# Patient Record
Sex: Female | Born: 1975 | Race: Black or African American | Hispanic: No | Marital: Married | Smoking: Former smoker
Health system: Southern US, Community
[De-identification: ages and names within clinical notes are randomized; demographics above are authoritative.]

## PROBLEM LIST (undated history)

## (undated) HISTORY — PX: TUBAL LIGATION: SHX77

---

## 2004-08-01 ENCOUNTER — Emergency Department: Payer: Self-pay | Admitting: Emergency Medicine

## 2004-09-25 ENCOUNTER — Emergency Department: Payer: Self-pay | Admitting: General Practice

## 2005-09-01 ENCOUNTER — Emergency Department: Payer: Self-pay | Admitting: Emergency Medicine

## 2007-03-29 ENCOUNTER — Emergency Department: Payer: Self-pay | Admitting: Unknown Physician Specialty

## 2008-07-13 ENCOUNTER — Emergency Department: Payer: Self-pay | Admitting: Emergency Medicine

## 2009-10-02 ENCOUNTER — Emergency Department: Payer: Self-pay | Admitting: Emergency Medicine

## 2010-09-12 ENCOUNTER — Emergency Department: Payer: Self-pay | Admitting: Unknown Physician Specialty

## 2011-12-17 ENCOUNTER — Emergency Department: Payer: Self-pay | Admitting: Emergency Medicine

## 2011-12-17 LAB — BASIC METABOLIC PANEL
Anion Gap: 12 (ref 7–16)
Calcium, Total: 9.1 mg/dL (ref 8.5–10.1)
Co2: 23 mmol/L (ref 21–32)
Creatinine: 0.56 mg/dL — ABNORMAL LOW (ref 0.60–1.30)
Potassium: 3.8 mmol/L (ref 3.5–5.1)
Sodium: 143 mmol/L (ref 136–145)

## 2011-12-17 LAB — URINALYSIS, COMPLETE
Bacteria: NONE SEEN
Glucose,UR: NEGATIVE mg/dL (ref 0–75)
Ketone: NEGATIVE
Leukocyte Esterase: NEGATIVE
Nitrite: NEGATIVE
RBC,UR: NONE SEEN /HPF (ref 0–5)
Specific Gravity: 1.002 (ref 1.003–1.030)
Squamous Epithelial: <1

## 2011-12-17 LAB — DRUG SCREEN, URINE
Benzodiazepine, Ur Scrn: NEGATIVE (ref ?–200)
MDMA (Ecstasy)Ur Screen: NEGATIVE (ref ?–500)
Methadone, Ur Screen: NEGATIVE (ref ?–300)
Opiate, Ur Screen: NEGATIVE (ref ?–300)
Phencyclidine (PCP) Ur S: NEGATIVE (ref ?–25)

## 2011-12-17 LAB — ETHANOL: Ethanol: 392 mg/dL

## 2011-12-17 LAB — CBC
MCH: 32.7 pg (ref 26.0–34.0)
MCHC: 32.3 g/dL (ref 32.0–36.0)
RDW: 12.4 % (ref 11.5–14.5)

## 2011-12-17 LAB — PREGNANCY, URINE: Pregnancy Test, Urine: NEGATIVE m[IU]/mL

## 2017-04-02 ENCOUNTER — Encounter: Payer: Self-pay | Admitting: Emergency Medicine

## 2017-04-02 ENCOUNTER — Emergency Department
Admission: EM | Admit: 2017-04-02 | Discharge: 2017-04-02 | Disposition: A | Discharge status: Home / Self Care | Payer: Self-pay | Attending: Emergency Medicine | Admitting: Emergency Medicine

## 2017-04-02 DIAGNOSIS — Z5321 Procedure and treatment not carried out due to patient leaving prior to being seen by health care provider: Secondary | ICD-10-CM | POA: Insufficient documentation

## 2017-04-02 DIAGNOSIS — J069 Acute upper respiratory infection, unspecified: Secondary | ICD-10-CM | POA: Insufficient documentation

## 2017-04-02 NOTE — ED Triage Notes (Signed)
Symptoms for 2 days.  Reports nasal congestion, sneezing and headache.

## 2018-03-26 ENCOUNTER — Emergency Department
Admission: EM | Admit: 2018-03-26 | Discharge: 2018-03-26 | Disposition: A | Discharge status: Home / Self Care | Payer: Self-pay | Attending: Emergency Medicine | Admitting: Emergency Medicine

## 2018-03-26 ENCOUNTER — Encounter: Payer: Self-pay | Admitting: Emergency Medicine

## 2018-03-26 ENCOUNTER — Other Ambulatory Visit: Payer: Self-pay

## 2018-03-26 DIAGNOSIS — N390 Urinary tract infection, site not specified: Secondary | ICD-10-CM | POA: Insufficient documentation

## 2018-03-26 LAB — URINALYSIS, COMPLETE (UACMP) WITH MICROSCOPIC
Bilirubin Urine: NEGATIVE
Glucose, UA: NEGATIVE mg/dL
Hgb urine dipstick: NEGATIVE
Ketones, ur: NEGATIVE mg/dL
Leukocytes, UA: NEGATIVE
Nitrite: NEGATIVE
PROTEIN: NEGATIVE mg/dL
Specific Gravity, Urine: 1.008 (ref 1.005–1.030)
pH: 7 (ref 5.0–8.0)

## 2018-03-26 LAB — COMPREHENSIVE METABOLIC PANEL
ALBUMIN: 3.8 g/dL (ref 3.5–5.0)
ALT: 15 U/L (ref 0–44)
ANION GAP: 6 (ref 5–15)
AST: 16 U/L (ref 15–41)
Alkaline Phosphatase: 40 U/L (ref 38–126)
BUN: 11 mg/dL (ref 6–20)
CHLORIDE: 103 mmol/L (ref 98–111)
CO2: 29 mmol/L (ref 22–32)
Calcium: 8.9 mg/dL (ref 8.9–10.3)
Creatinine, Ser: 0.62 mg/dL (ref 0.44–1.00)
GFR calc non Af Amer: >60 mL/min (ref >60)
GLUCOSE: 103 mg/dL — AB (ref 70–99)
Potassium: 4.6 mmol/L (ref 3.5–5.1)
SODIUM: 138 mmol/L (ref 135–145)
Total Bilirubin: 0.5 mg/dL (ref 0.3–1.2)
Total Protein: 6.1 g/dL — ABNORMAL LOW (ref 6.5–8.1)

## 2018-03-26 LAB — CBC
HCT: 39.1 % (ref 35.0–47.0)
HEMOGLOBIN: 13.4 g/dL (ref 12.0–16.0)
MCH: 33.4 pg (ref 26.0–34.0)
MCHC: 34.2 g/dL (ref 32.0–36.0)
MCV: 97.5 fL (ref 80.0–100.0)
Platelets: 373 10*3/uL (ref 150–440)
RBC: 4.01 MIL/uL (ref 3.80–5.20)
RDW: 13 % (ref 11.5–14.5)
WBC: 7.4 10*3/uL (ref 3.6–11.0)

## 2018-03-26 LAB — POCT PREGNANCY, URINE: PREG TEST UR: NEGATIVE

## 2018-03-26 LAB — LIPASE, BLOOD: LIPASE: 28 U/L (ref 11–51)

## 2018-03-26 MED ORDER — CEPHALEXIN 500 MG PO CAPS: 500.0000 mg | ORAL_CAPSULE | Freq: Three times a day (TID) | ORAL | 0 refills | Status: DC

## 2018-03-26 MED ORDER — CEPHALEXIN 500 MG PO CAPS
500.0000 mg | ORAL_CAPSULE | Freq: Once | ORAL | Status: AC
  Administered 2018-03-26: 500 mg via ORAL
  Filled 2018-03-26: qty 1

## 2018-03-26 MED ORDER — TRAMADOL HCL 50 MG PO TABS: 50.0000 mg | ORAL_TABLET | Freq: Four times a day (QID) | ORAL | 0 refills | Status: DC | PRN

## 2018-03-26 NOTE — Discharge Instructions (Addendum)
If you have increased pain, fever, vomiting, or you feel worse in any way return to the emergency room.  We did not do a CT scan, S1 does not appear to be indicated now but if things worsen we may need to do something like that.  Take the antibiotics until they are gone.  If you do have a urinary tract infection that needs different antibiotics we will call you.  Do not drink or drive on the pain medication we have prescribed.  Try managing pain with Tylenol and Motrin as best you can as directed, only use the tramadol for breakthrough pain but do not drive on it

## 2018-03-26 NOTE — ED Notes (Signed)
Pt now reports she was urinating a whole lot now back to normal

## 2018-03-26 NOTE — ED Triage Notes (Signed)
LLQ pain for 2 weeks.  Has also been having lower back pain for same amount of time. Intermittent dysuria.  No fevers. Denies NVD.  Ambulatory. VSS

## 2018-03-26 NOTE — ED Provider Notes (Signed)
[facility] Emergency Department Provider Note  ____________________________________________   I have reviewed the triage vital signs and the nursing notes. Where available I have reviewed prior notes and, if possible and indicated, outside hospital notes.    HISTORY  Chief Complaint Abdominal Pain    HPI Cleon GustinHeather M Kue is a 42 y.o. female who is healthy, she states that she has had dysuria for couple days followed by some mild left flank pain.  No fever no chills.  She also has low back discomfort.  No abdominal pain no vomiting no chills no fever, normal bowel movements.  Pain is not severe no recent trauma no numbness no weakness no incontinence of bowel bladder no IV drug abuse, no vaginal discharge, no pelvic pain.  Just dysuria now a little bit of pain on the left flank and some low back pain   History reviewed. No pertinent past medical history.  There are no active problems to display for this patient.   Past Surgical History:  Procedure Laterality Date  . CESAREAN SECTION      Prior to Admission medications   Not on File    Allergies Patient has no known allergies.  History reviewed. No pertinent family history.  Social History Social History   Tobacco Use  . Smoking status: Former Games developermoker  . Smokeless tobacco: Never Used  Substance Use Topics  . Alcohol use: Never    Frequency: Never  . Drug use: Never    Review of Systems Constitutional: No fever/chills Eyes: No visual changes. ENT: No sore throat. No stiff neck no neck pain Cardiovascular: Denies chest pain. Respiratory: Denies shortness of breath. Gastrointestinal:   no vomiting.  No diarrhea.  No constipation. Genitourinary: Negative for dysuria. Musculoskeletal: Negative lower extremity swelling Skin: Negative for rash. Neurological: Negative for severe headaches, focal weakness or numbness.   ____________________________________________   PHYSICAL EXAM:  VITAL  SIGNS: ED Triage Vitals [03/26/18 1733]  Enc Vitals Group     BP (!) 144/86     Pulse Rate 90     Resp 16     Temp 98.7 F (37.1 C)     Temp Source Oral     SpO2 100 %     Weight 155 lb (70.3 kg)     Height 5\' 7"  (1.702 m)     Head Circumference      Peak Flow      Pain Score 10     Pain Loc      Pain Edu?      Excl. in GC?     Constitutional: Alert and oriented. Well appearing and in no acute distress. Eyes: Conjunctivae are normal Head: Atraumatic HEENT: No congestion/rhinnorhea. Mucous membranes are moist.  Oropharynx non-erythematous Neck:   Nontender with no meningismus, no masses, no stridor Cardiovascular: Normal rate, regular rhythm. Grossly normal heart sounds.  Good peripheral circulation. Respiratory: Normal respiratory effort.  No retractions. Lungs CTAB. Abdominal: Soft and nontender. No distention. No guarding no rebound Back:  There is no focal tenderness or step off.  there is no midline tenderness there are no lesions noted. there is very slight left CVA tenderness  Musculoskeletal: No lower extremity tenderness, no upper extremity tenderness. No joint effusions, no DVT signs strong distal pulses no edema Neurologic:  Normal speech and language. No gross focal neurologic deficits are appreciated.  Skin:  Skin is warm, dry and intact. No rash noted. Psychiatric: Mood and affect are normal. Speech and behavior are normal.  ____________________________________________   LABS (all labs ordered are listed, but only abnormal results are displayed)  Labs Reviewed  COMPREHENSIVE METABOLIC PANEL - Abnormal; Notable for the following components:      Result Value   Glucose, Bld 103 (*)    Total Protein 6.1 (*)    All other components within normal limits  URINALYSIS, COMPLETE (UACMP) WITH MICROSCOPIC - Abnormal; Notable for the following components:   Color, Urine STRAW (*)    APPearance CLEAR (*)    Bacteria, UA RARE (*)    All other components within normal  limits  URINE CULTURE  LIPASE, BLOOD  CBC  POC URINE PREG, ED  POCT PREGNANCY, URINE    Pertinent labs  results that were available during my care of the patient were reviewed by me and considered in my medical decision making (see chart for details). ____________________________________________  EKG  I personally interpreted any EKGs ordered by me or triage  ____________________________________________  RADIOLOGY  Pertinent labs & imaging results that were available during my care of the patient were reviewed by me and considered in my medical decision making (see chart for details). If possible, patient and/or family made aware of any abnormal findings.  No results found. ____________________________________________    PROCEDURES  Procedure(s) performed: None  Procedures  Critical Care performed: None  ____________________________________________   INITIAL IMPRESSION / ASSESSMENT AND PLAN / ED COURSE  Pertinent labs & imaging results that were available during my care of the patient were reviewed by me and considered in my medical decision making (see chart for details).  Patient here with dysuria, urinary frequency, now starting to have some flank pain.  Abdomen is nontender bowel signs are reassuring blood work is reassuring her white cells and bacteria in her urine we are sending a urine culture, no evidence of infected kidney stone no evidence of appendicitis.  However, patient understands that if she feels worse in any way she is return to the emergency room.  We will send her home with pain medication and antibiotics she understands that if there is any change in her presentation she is to return.    ____________________________________________   FINAL CLINICAL IMPRESSION(S) / ED DIAGNOSES  Final diagnoses:  None      This chart was dictated using voice recognition software.  Despite best efforts to proofread,  errors can occur which can change meaning.       Jeanmarie Plant, MD 03/26/18 1925

## 2018-03-26 NOTE — ED Notes (Signed)
No peripheral IV placed this visit.   Discharge instructions reviewed with patient. Questions fielded by this RN. Patient verbalizes understanding of instructions. Patient discharged home in stable condition per mcshane. No acute distress noted at time of discharge.    

## 2018-03-29 LAB — URINE CULTURE: Culture: 40000 — AB

## 2018-10-10 ENCOUNTER — Encounter: Payer: Self-pay | Admitting: Emergency Medicine

## 2018-10-10 ENCOUNTER — Emergency Department: Payer: Worker's Compensation

## 2018-10-10 ENCOUNTER — Emergency Department
Admission: EM | Admit: 2018-10-10 | Discharge: 2018-10-10 | Disposition: A | Discharge status: Home / Self Care | Payer: Worker's Compensation | Attending: Emergency Medicine | Admitting: Emergency Medicine

## 2018-10-10 ENCOUNTER — Other Ambulatory Visit: Payer: Self-pay

## 2018-10-10 DIAGNOSIS — S60911A Unspecified superficial injury of right wrist, initial encounter: Secondary | ICD-10-CM | POA: Diagnosis present

## 2018-10-10 DIAGNOSIS — Y9259 Other trade areas as the place of occurrence of the external cause: Secondary | ICD-10-CM | POA: Insufficient documentation

## 2018-10-10 DIAGNOSIS — X509XXA Other and unspecified overexertion or strenuous movements or postures, initial encounter: Secondary | ICD-10-CM | POA: Diagnosis not present

## 2018-10-10 DIAGNOSIS — S63501A Unspecified sprain of right wrist, initial encounter: Secondary | ICD-10-CM | POA: Insufficient documentation

## 2018-10-10 DIAGNOSIS — Y990 Civilian activity done for income or pay: Secondary | ICD-10-CM | POA: Insufficient documentation

## 2018-10-10 DIAGNOSIS — Z87891 Personal history of nicotine dependence: Secondary | ICD-10-CM | POA: Insufficient documentation

## 2018-10-10 DIAGNOSIS — Y939 Activity, unspecified: Secondary | ICD-10-CM | POA: Insufficient documentation

## 2018-10-10 MED ORDER — NAPROXEN 500 MG PO TABS: 500.0000 mg | ORAL_TABLET | Freq: Two times a day (BID) | ORAL | 0 refills | Status: DC

## 2018-10-10 MED ORDER — NAPROXEN 500 MG PO TABS
500.0000 mg | ORAL_TABLET | Freq: Once | ORAL | Status: AC
  Administered 2018-10-10: 500 mg via ORAL
  Filled 2018-10-10: qty 1

## 2018-10-10 NOTE — ED Triage Notes (Signed)
Pt was trying to keep box from falling off table and hurt right wrist. No obvious deformity.  ambulatory.

## 2018-10-10 NOTE — ED Provider Notes (Signed)
[facility] Emergency Department Provider Note  ____________________________________________   First MD Initiated Contact with Patient 10/10/18 1200     (approximate)  I have reviewed the triage vital signs and the nursing notes.   HISTORY  Chief Complaint Wrist Pain   HPI Paige Wade is a 43 y.o. female presents to the ED with complaint of right wrist pain.  Patient states that she was working second shift and was almost ready to get off work when she was trying to catch a box from falling from a table when she injured her wrist.  Apparently she did not report this to anyone and now is in the ED for it to be evaluated.  Company was called and they state that patient will need to have a drug screen before leaving.  She rates her pain as a 10/10.     History reviewed. No pertinent past medical history.  There are no active problems to display for this patient.   Past Surgical History:  Procedure Laterality Date   CESAREAN SECTION      Prior to Admission medications   Medication Sig Start Date End Date Taking? Authorizing Provider  naproxen (NAPROSYN) 500 MG tablet Take 1 tablet (500 mg total) by mouth 2 (two) times daily with a meal. 10/10/18   Tommi RumpsSummers, Contrell Ballentine L, PA-C    Allergies Patient has no known allergies.  History reviewed. No pertinent family history.  Social History Social History   Tobacco Use   Smoking status: Former Smoker   Smokeless tobacco: Never Used  Substance Use Topics   Alcohol use: Never    Frequency: Never   Drug use: Never    Review of Systems Constitutional: No fever/chills Cardiovascular: Denies chest pain. Respiratory: Denies shortness of breath. Musculoskeletal: Positive for right wrist pain. Skin: Negative for rash. Neurological: Negative for headaches, focal weakness or numbness. ___________________________________________   PHYSICAL EXAM:  VITAL SIGNS: ED Triage Vitals  Enc Vitals Group    BP 10/10/18 1155 (!) 136/93     Pulse Rate 10/10/18 1155 (!) 105     Resp 10/10/18 1155 16     Temp 10/10/18 1155 98.4 F (36.9 C)     Temp Source 10/10/18 1155 Oral     SpO2 10/10/18 1155 99 %     Weight 10/10/18 1153 153 lb (69.4 kg)     Height 10/10/18 1153 5\' 7"  (1.702 m)     Head Circumference --      Peak Flow --      Pain Score 10/10/18 1153 10     Pain Loc --      Pain Edu? --      Excl. in GC? --    Constitutional: Alert and oriented. Well appearing and in no acute distress. Eyes: Conjunctivae are normal.  Head: Atraumatic. Neck: No stridor.   Cardiovascular: Normal rate, regular rhythm. Grossly normal heart sounds.  Good peripheral circulation. Respiratory: Normal respiratory effort.  No retractions. Lungs CTAB. Musculoskeletal: Right wrist without any signs of injury, soft tissue swelling or discoloration.  Range of motion is decreased in all planes secondary to discomfort.  Patient is able to move digits without any difficulty.  Skin is intact.  Capillary refill is less than 3 seconds.  There is generalized tenderness on palpation of the wrist but no point tenderness. Neurologic:  Normal speech and language. No gross focal neurologic deficits are appreciated. No gait instability. Skin:  Skin is warm, dry and intact. No rash noted.  Psychiatric: Mood and affect are normal. Speech and behavior are normal.  ____________________________________________   LABS (all labs ordered are listed, but only abnormal results are displayed)  Labs Reviewed - No data to display  RADIOLOGY  Official radiology report(s): Dg Wrist Complete Right  Result Date: 10/10/2018 CLINICAL DATA:  43 year old female with a history of wrist pain EXAM: RIGHT WRIST - COMPLETE 3+ VIEW COMPARISON:  None. FINDINGS: No acute displaced fracture. No focal soft tissue swelling. No radiopaque foreign body. No significant degenerative changes. IMPRESSION: Negative for acute bony Electronically Signed   By:  Gilmer Mor D.O.   On: 10/10/2018 12:51    ____________________________________________   PROCEDURES  Procedure(s) performed (including Critical Care):  Procedures Cock-up wrist splint was applied to the right wrist by Asencion Partridge, ED tech.  ____________________________________________   INITIAL IMPRESSION / ASSESSMENT AND PLAN / ED COURSE  As part of my medical decision making, I reviewed the following data within the electronic MEDICAL RECORD NUMBER Notes from prior ED visits and [place] Controlled Substance Database  43 year old female presents to the ED with complaint of injury to her right wrist at the end of her shift while at work this morning at approximately 2 AM.  Patient states that she was trying to keep a box from falling off the table when she hurt her wrist.  X-rays were reassuring and physical exam does not show any gross abnormality.  Patient was given naproxen 500 mg in the ED along with a cock-up wrist splint.  She is to continue with the naproxen.  She was instructed to use ice and elevation.  She is to follow-up with her supervisor for light duty and follow-up with [facility] clinic if any continued problems.  ____________________________________________   FINAL CLINICAL IMPRESSION(S) / ED DIAGNOSES  Final diagnoses:  Sprain of right wrist, initial encounter     ED Discharge Orders         Ordered    naproxen (NAPROSYN) 500 MG tablet  2 times daily with meals     10/10/18 1258           Note:  This document was prepared using Dragon voice recognition software and may include unintentional dictation errors.    Tommi Rumps, PA-C 10/10/18 1417    Emily Filbert, MD 10/10/18 [phone]

## 2018-10-10 NOTE — ED Notes (Signed)
See triage note  Presents with pain to right wrist  States she tried to catch a box from falling from table last pm  States she works 2nd shift and gets off at 2 am  States pain woke her up  Tenderness noted to lateral aspect of wrist   Good pulses

## 2018-10-10 NOTE — Discharge Instructions (Signed)
Follow-up with a [facility] if any continued problems.  Ice and elevation today.  Also follow-up with the appropriate people at your work for determination of light duty. Begin taking naproxen 500 mg twice daily with food.  Wear wrist splint for support and protection.

## 2019-07-12 ENCOUNTER — Encounter: Payer: Self-pay | Admitting: Emergency Medicine

## 2019-07-12 ENCOUNTER — Emergency Department
Admission: EM | Admit: 2019-07-12 | Discharge: 2019-07-12 | Disposition: A | Discharge status: Home / Self Care | Payer: Self-pay | Attending: Student in an Organized Health Care Education/Training Program | Admitting: Student in an Organized Health Care Education/Training Program

## 2019-07-12 ENCOUNTER — Other Ambulatory Visit: Payer: Self-pay

## 2019-07-12 ENCOUNTER — Emergency Department: Payer: Self-pay

## 2019-07-12 DIAGNOSIS — Z87891 Personal history of nicotine dependence: Secondary | ICD-10-CM | POA: Insufficient documentation

## 2019-07-12 DIAGNOSIS — R0602 Shortness of breath: Secondary | ICD-10-CM | POA: Insufficient documentation

## 2019-07-12 DIAGNOSIS — F410 Panic disorder [episodic paroxysmal anxiety] without agoraphobia: Secondary | ICD-10-CM | POA: Insufficient documentation

## 2019-07-12 LAB — CBC WITH DIFFERENTIAL/PLATELET
Abs Immature Granulocytes: 0.01 10*3/uL (ref 0.00–0.07)
Basophils Absolute: 0.1 10*3/uL (ref 0.0–0.1)
Basophils Relative: 1 %
Eosinophils Absolute: 0.1 10*3/uL (ref 0.0–0.5)
Eosinophils Relative: 2 %
HCT: 39.8 % (ref 36.0–46.0)
Hemoglobin: 13.5 g/dL (ref 12.0–15.0)
Immature Granulocytes: 0 %
Lymphocytes Relative: 34 %
Lymphs Abs: 1.5 10*3/uL (ref 0.7–4.0)
MCH: 31.4 pg (ref 26.0–34.0)
MCHC: 33.9 g/dL (ref 30.0–36.0)
MCV: 92.6 fL (ref 80.0–100.0)
Monocytes Absolute: 0.4 10*3/uL (ref 0.1–1.0)
Monocytes Relative: 8 %
Neutro Abs: 2.5 10*3/uL (ref 1.7–7.7)
Neutrophils Relative %: 55 %
Platelets: 326 10*3/uL (ref 150–400)
RBC: 4.3 MIL/uL (ref 3.87–5.11)
RDW: 12.6 % (ref 11.5–15.5)
WBC: 4.5 10*3/uL (ref 4.0–10.5)
nRBC: 0 % (ref 0.0–0.2)

## 2019-07-12 LAB — COMPREHENSIVE METABOLIC PANEL
ALT: 40 U/L (ref 0–44)
AST: 40 U/L (ref 15–41)
Albumin: 4.5 g/dL (ref 3.5–5.0)
Alkaline Phosphatase: 75 U/L (ref 38–126)
Anion gap: 14 (ref 5–15)
BUN: 6 mg/dL (ref 6–20)
CO2: 19 mmol/L — ABNORMAL LOW (ref 22–32)
Calcium: 9.4 mg/dL (ref 8.9–10.3)
Chloride: 105 mmol/L (ref 98–111)
Creatinine, Ser: 0.67 mg/dL (ref 0.44–1.00)
GFR calc Af Amer: >60 mL/min (ref >60)
GFR calc non Af Amer: >60 mL/min (ref >60)
Glucose, Bld: 99 mg/dL (ref 70–99)
Potassium: 3.9 mmol/L (ref 3.5–5.1)
Sodium: 138 mmol/L (ref 135–145)
Total Bilirubin: 0.8 mg/dL (ref 0.3–1.2)
Total Protein: 7.7 g/dL (ref 6.5–8.1)

## 2019-07-12 MED ORDER — DIAZEPAM 5 MG PO TABS
ORAL_TABLET | ORAL | Status: AC
  Filled 2019-07-12: qty 1

## 2019-07-12 MED ORDER — DIAZEPAM 5 MG PO TABS
5.0000 mg | ORAL_TABLET | Freq: Once | ORAL | Status: AC
  Administered 2019-07-12: 19:00:00 5 mg via ORAL
  Filled 2019-07-12: qty 1

## 2019-07-12 MED ORDER — SODIUM CHLORIDE 0.9 % IV BOLUS
500.0000 mL | Freq: Once | INTRAVENOUS | Status: AC
  Administered 2019-07-12: 500 mL via INTRAVENOUS

## 2019-07-12 NOTE — ED Triage Notes (Signed)
Patient states symptoms feels like a panic attack.

## 2019-07-12 NOTE — Discharge Instructions (Addendum)
Please establish care with primary care physician to help manage her anxiety.  Please review reading material on managing anxiety.  Return to the ER for any worsening symptoms or urgent changes in health.

## 2019-07-12 NOTE — ED Triage Notes (Signed)
Pt comes into the ED via EMS from the [facility] with numbness/tingling, SOb that started about PTA, 100%RA , ST, #20 LFA, 98.3, CBG 119. Pt was anxious when EMS arrival.

## 2019-07-12 NOTE — ED Notes (Addendum)
Patient reports shortness of breath today worsening gradually throughout the day.  Patient reports chills and hot flashes as well as feeling dizzy and light headed today.  Patient denies vomiting or diarrhea.  Patient states she feels very thirsty, denies polyuria.  Patient is alert and oriented x 4.  Skin is warm and dry.  Patient states, "this has happened before, it might have been dehydration because I had to drink a lot of water for it to stop."  Patient reports increased stress and anxiety today.

## 2019-07-12 NOTE — ED Provider Notes (Signed)
[facility] EMERGENCY DEPARTMENT Provider Note   CSN: [id number] Arrival date & time: 07/12/19  1457     History Chief Complaint  Patient presents with  . Shortness of Breath    Paige Wade is a 44 y.o. female presents to the emergency department for evaluation of shortness of breath.  Patient states she woke up with shortness of breath this morning, states she was hyperventilating and panicking.  She describes a heaviness in her chest along with numbness and tingling along her lips and digits of both hands.  Her symptoms lasted on and off 2030 minutes this morning.  She denies any cough, congestion, runny nose, fevers, abdominal pain, nausea or vomiting.  She does admit to having some anxiety, having stressful time with her husband as they have been fussing.  She admits to having similar symptoms a month ago that resolved on their own.  Currently she feels 75% better in regards to her shortness of breath.  She denies any current chest pain or discomfort.  No wheezing.  No history of blood clots, lower extremity swelling, birth control medications.  Patient does smoke.  She admits to not eating or drinking over the last couple of days due to lack of appetite which is overall due to constant stressful time with her husband.  HPI     History reviewed. No pertinent past medical history.  There are no problems to display for this patient.   Past Surgical History:  Procedure Laterality Date  . CESAREAN SECTION       OB History   No obstetric history on file.     No family history on file.  Social History   Tobacco Use  . Smoking status: Former Research scientist (life sciences)  . Smokeless tobacco: Never Used  Substance Use Topics  . Alcohol use: Never  . Drug use: Never    Home Medications Prior to Admission medications   Not on File    Allergies    Patient has no known allergies.  Review of Systems   Review of Systems  Constitutional: Negative for chills, fatigue  and fever.  HENT: Negative for trouble swallowing.   Respiratory: Positive for shortness of breath. Negative for cough, choking and chest tightness.   Cardiovascular: Negative for chest pain.  Gastrointestinal: Negative for abdominal pain, nausea and vomiting.  Skin: Negative for rash.  Neurological: Negative for dizziness, light-headedness and headaches.    Physical Exam Updated Vital Signs BP (!) 166/97 (BP Location: Left Arm)   Pulse (!) 114   Temp 98.9 F (37.2 C) (Oral)   Resp 18   Ht 5\' 7"  (1.702 m)   Wt 69.4 kg   LMP 06/18/2019   SpO2 99%   BMI 23.96 kg/m   Physical Exam Constitutional:      General: She is not in acute distress.    Appearance: She is well-developed. She is not ill-appearing.  HENT:     Head: Normocephalic and atraumatic.     Nose: Nose normal. No congestion or rhinorrhea.     Mouth/Throat:     Mouth: Mucous membranes are moist.     Pharynx: No oropharyngeal exudate or posterior oropharyngeal erythema.  Eyes:     Conjunctiva/sclera: Conjunctivae normal.  Cardiovascular:     Rate and Rhythm: Tachycardia present.  Pulmonary:     Effort: Pulmonary effort is normal. No respiratory distress.     Breath sounds: Normal breath sounds. No stridor. No wheezing or rales.     Comments: Initially  mild hyperventilation but after talking the patient she was much more relaxed.  Normal breathing and rate. Abdominal:     General: There is no distension.     Tenderness: There is no abdominal tenderness. There is no guarding.  Musculoskeletal:        General: No swelling or tenderness. Normal range of motion.     Cervical back: Normal range of motion.     Right lower leg: No edema.     Left lower leg: No edema.  Skin:    General: Skin is warm.     Findings: No rash.  Neurological:     General: No focal deficit present.     Mental Status: She is alert and oriented to person, place, and time. Mental status is at baseline.  Psychiatric:        Behavior:  Behavior normal.        Thought Content: Thought content normal.     ED Results / Procedures / Treatments   Labs (all labs ordered are listed, but only abnormal results are displayed) Labs Reviewed  COMPREHENSIVE METABOLIC PANEL - Abnormal; Notable for the following components:      Result Value   CO2 19 (*)    All other components within normal limits  CBC WITH DIFFERENTIAL/PLATELET    EKG None  Radiology DG Chest 2 View  Result Date: 07/12/2019 CLINICAL DATA:  Shortness of breath EXAM: CHEST - 2 VIEW COMPARISON:  None. FINDINGS: The heart size and mediastinal contours are within normal limits. Both lungs are clear. No pleural effusion or pneumothorax. The visualized skeletal structures are unremarkable. IMPRESSION: No acute process in the chest. Electronically Signed   By: Guadlupe Spanish M.D.   On: 07/12/2019 16:35    Procedures Procedures (including critical care time)  Medications Ordered in ED Medications  diazepam (VALIUM) 5 MG tablet (has no administration in time range)  sodium chloride 0.9 % bolus 500 mL (500 mLs Intravenous New Bag/Given 07/12/19 1913)  diazepam (VALIUM) tablet 5 mg (5 mg Oral Given 07/12/19 1909)    ED Course  I have reviewed the triage vital signs and the nursing notes.  Pertinent labs & imaging results that were available during my care of the patient were reviewed by me and considered in my medical decision making (see chart for details).    MDM Rules/Calculators/A&P                      44 year old female with shortness of breath.  Based on history exam she seems to be very anxious.  She admits not having much to eat or drink over the last 2 days due to stress.  Labs and chest x-ray as well as EKG are all normal.  Overall symptoms are 75% better.  No chest pain.  We will give her some IV fluids and have her continue with p.o. fluids.  We will also give her a small dose of Valium to help with anxiousness.  Will recheck symptoms of anxiousness,  shortness of breath and vitals after fluids and Valium.   After p.o. and IV fluids along with 5 mg of Valium patient symptoms had resolved.  She denies any shortness of breath or feelings of anxiousness.  Patient states she was able to rest and was feeling well.  Patient educated on anxiety and panic attacks.  She understands importance of establishing care with primary care provider.  She is given information on [facility].  She understands signs symptoms  return to ED for.  Final Clinical Impression(s) / ED Diagnoses Final diagnoses:  SOB (shortness of breath)  Panic attack    Rx / DC Orders ED Discharge Orders    None       Ronnette Juniper 07/12/19 2044    Willy Eddy, MD 07/12/19 2045

## 2020-01-15 ENCOUNTER — Telehealth: Payer: Self-pay | Admitting: General Practice

## 2020-01-15 NOTE — Telephone Encounter (Signed)
Individual has been contacted 3+ times regarding ED referral. No further attempts to contact individual will be made. 

## 2020-03-04 ENCOUNTER — Ambulatory Visit: Payer: Self-pay

## 2020-03-11 ENCOUNTER — Encounter: Payer: Self-pay | Admitting: Advanced Practice Midwife

## 2020-03-11 ENCOUNTER — Ambulatory Visit (LOCAL_COMMUNITY_HEALTH_CENTER): Payer: Medicaid Other | Admitting: Advanced Practice Midwife

## 2020-03-11 ENCOUNTER — Other Ambulatory Visit: Payer: Self-pay

## 2020-03-11 DIAGNOSIS — F17200 Nicotine dependence, unspecified, uncomplicated: Secondary | ICD-10-CM | POA: Diagnosis not present

## 2020-03-11 DIAGNOSIS — Z9851 Tubal ligation status: Secondary | ICD-10-CM

## 2020-03-11 DIAGNOSIS — Z3009 Encounter for other general counseling and advice on contraception: Secondary | ICD-10-CM | POA: Diagnosis not present

## 2020-03-11 DIAGNOSIS — I10 Essential (primary) hypertension: Secondary | ICD-10-CM | POA: Diagnosis not present

## 2020-03-11 NOTE — Progress Notes (Signed)
Patient here for PE, needs Pap test (last Pap test 2008, per patient). States had BTL in 2005. Patient counseled that due to her BTL, she may not receive family planning services at [facility] and she will need to seek primary care for PE, pap and BP follow-up. Patient counseled on need for BP follow-up asap due to elevated BP. Patient not seen by provider today, given PCP list and emergency services contact card. Patient was interested in covid vaccine and was walked down to registration area.Burt Knack, RN

## 2020-12-18 IMAGING — DX RIGHT WRIST - COMPLETE 3+ VIEW
4 series · 4 of 4 positions shown · non-contrast
Comparison: None.

CLINICAL DATA: 42-year-old female with a history of wrist pain

EXAM:
RIGHT WRIST - COMPLETE 3+ VIEW

[wrist ap (1 of 2)]
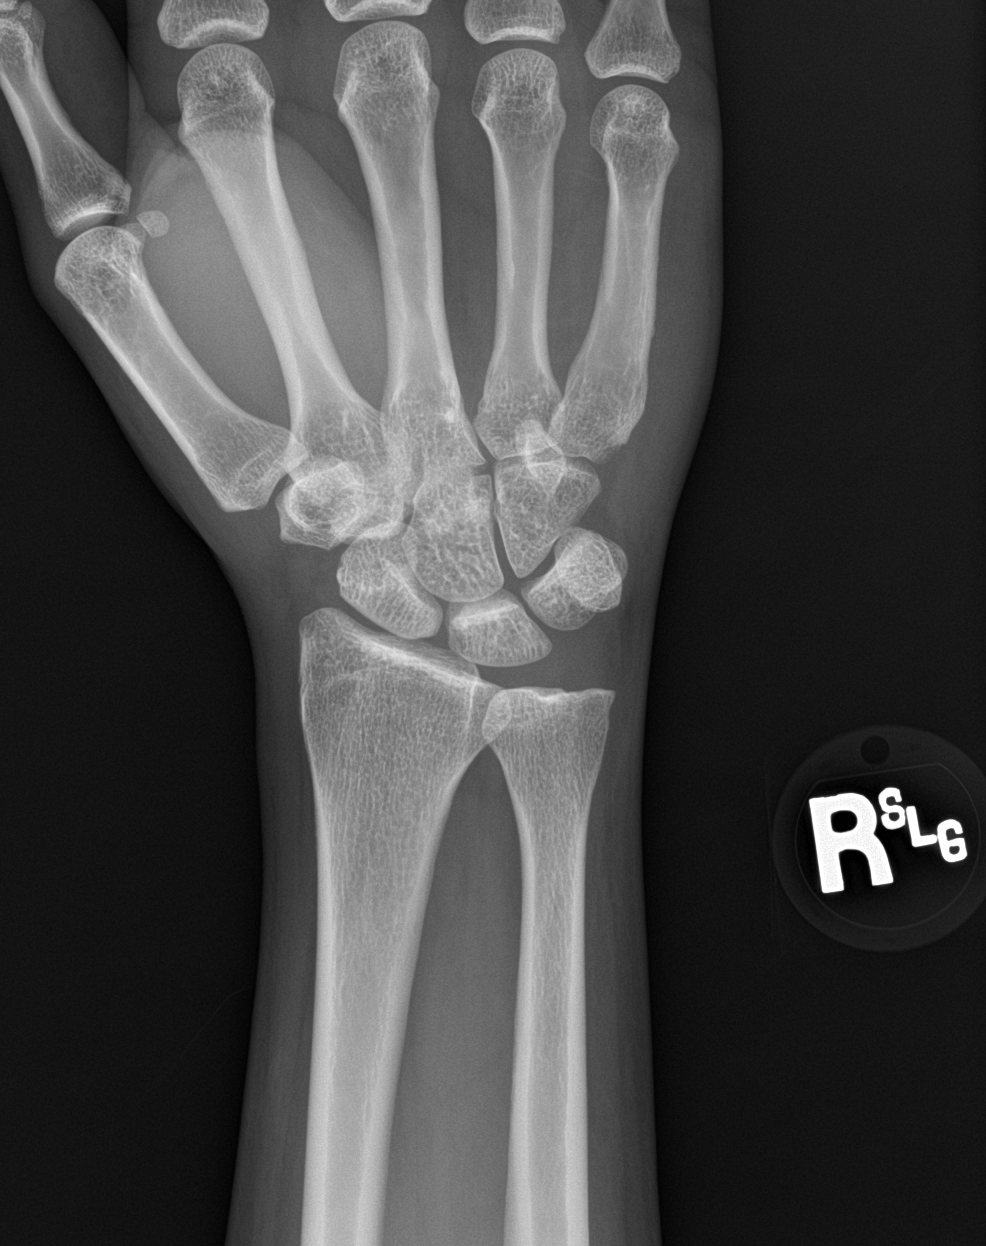

[wrist obl]
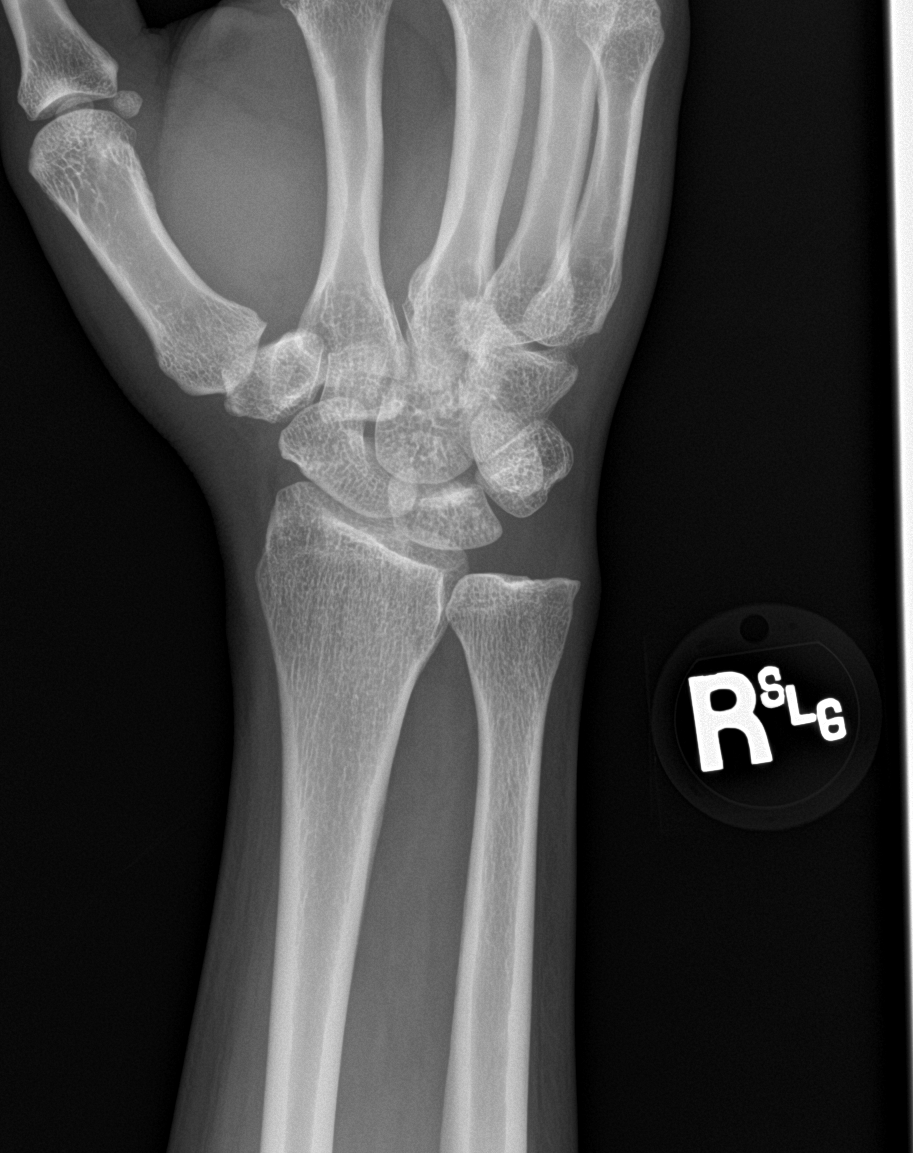

[wrist lat]
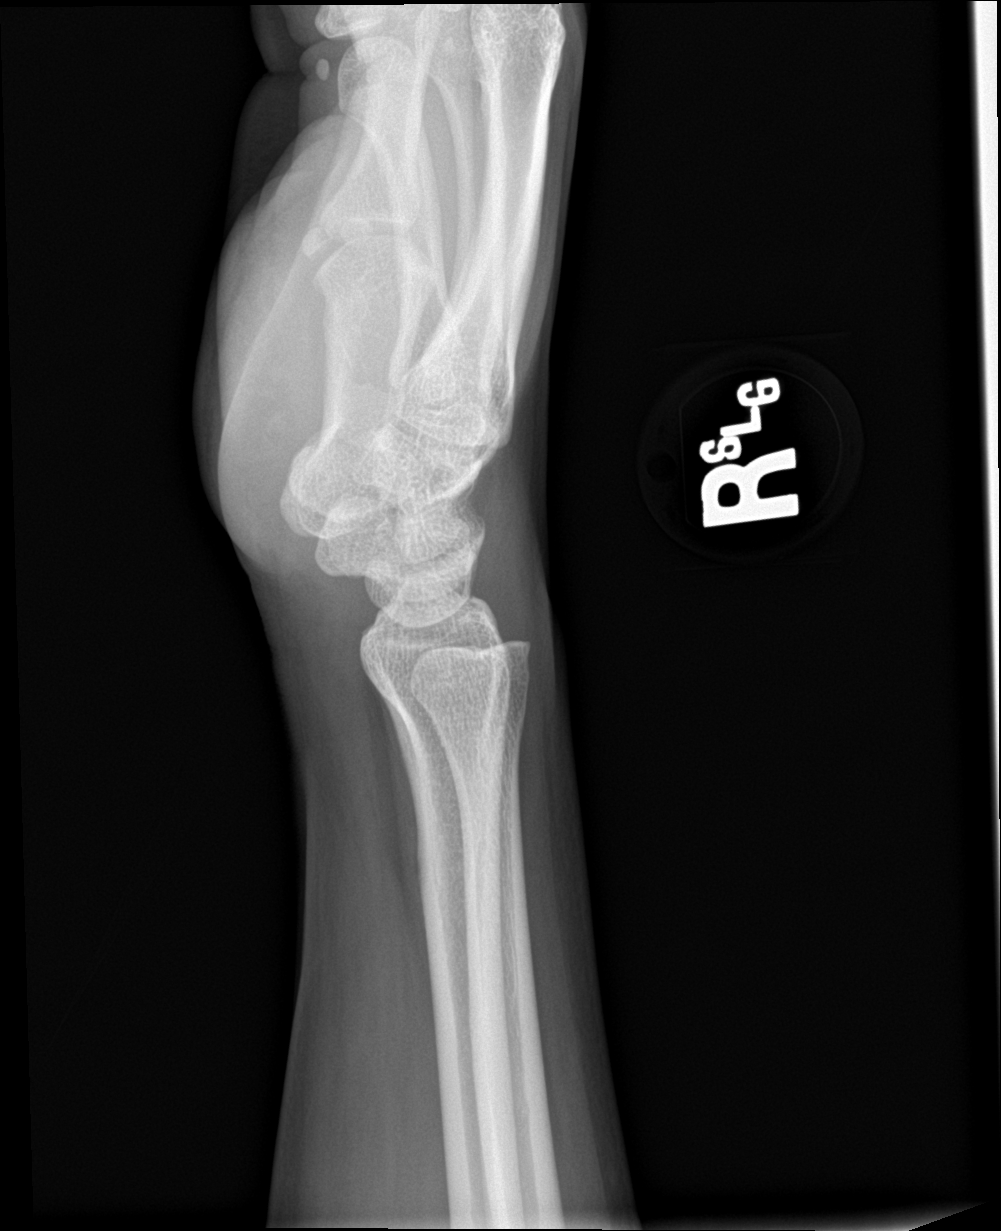

[wrist ap (2 of 2)]
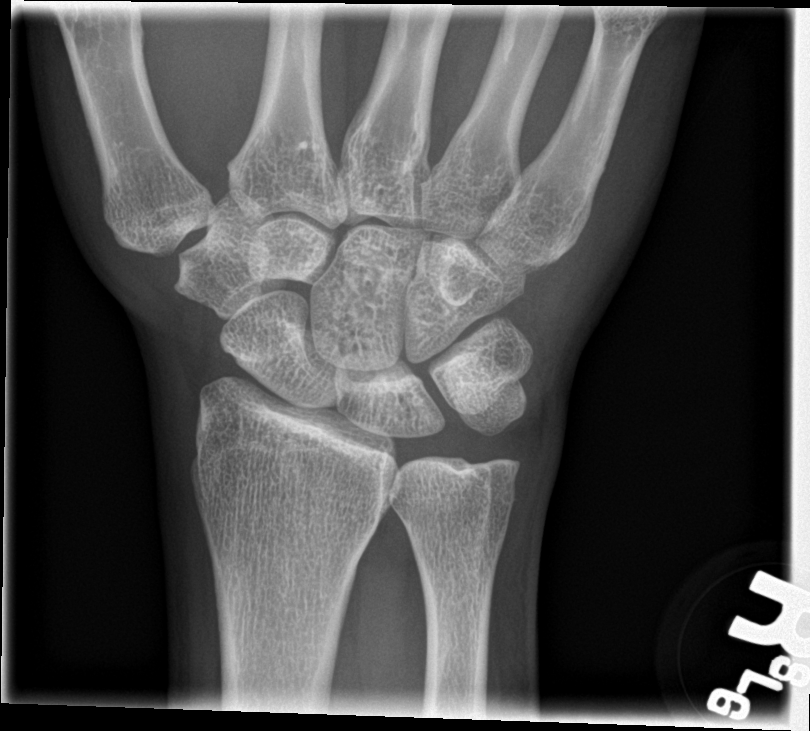

[4 of 4 positions shown; findings below may reference images not displayed]

FINDINGS: No acute displaced fracture. No focal soft tissue swelling. No
radiopaque foreign body. No significant degenerative changes.
IMPRESSION: Negative for acute bony

## 2021-09-19 IMAGING — CR DG CHEST 2V
1 series · 2 of 2 positions shown · non-contrast
Comparison: None.

CLINICAL DATA: Shortness of breath

EXAM:
CHEST - 2 VIEW

[Series 1: dg chest 2 view · 0.14mm/px · 2 of 2 slices shown]
[im 1/2]
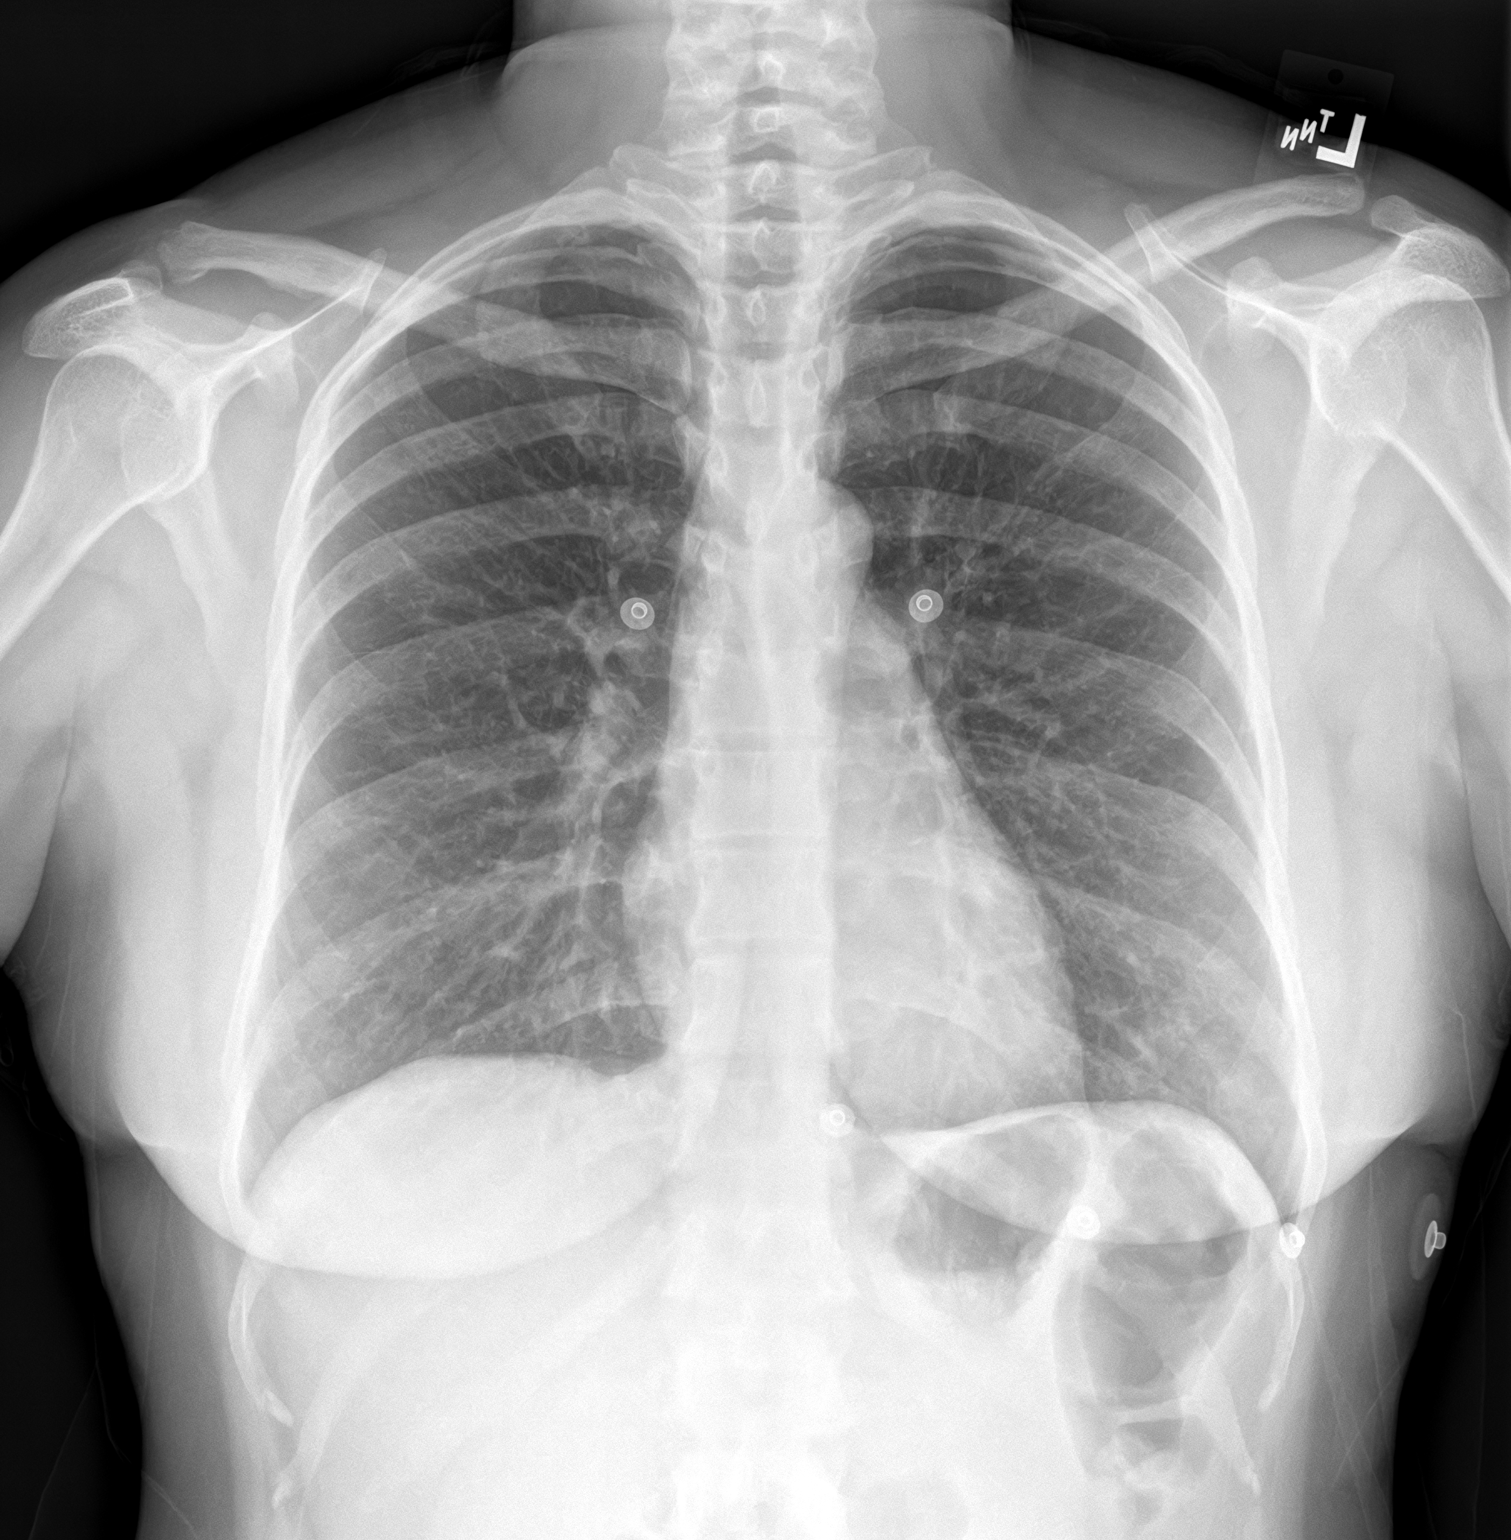
[im 2/2]
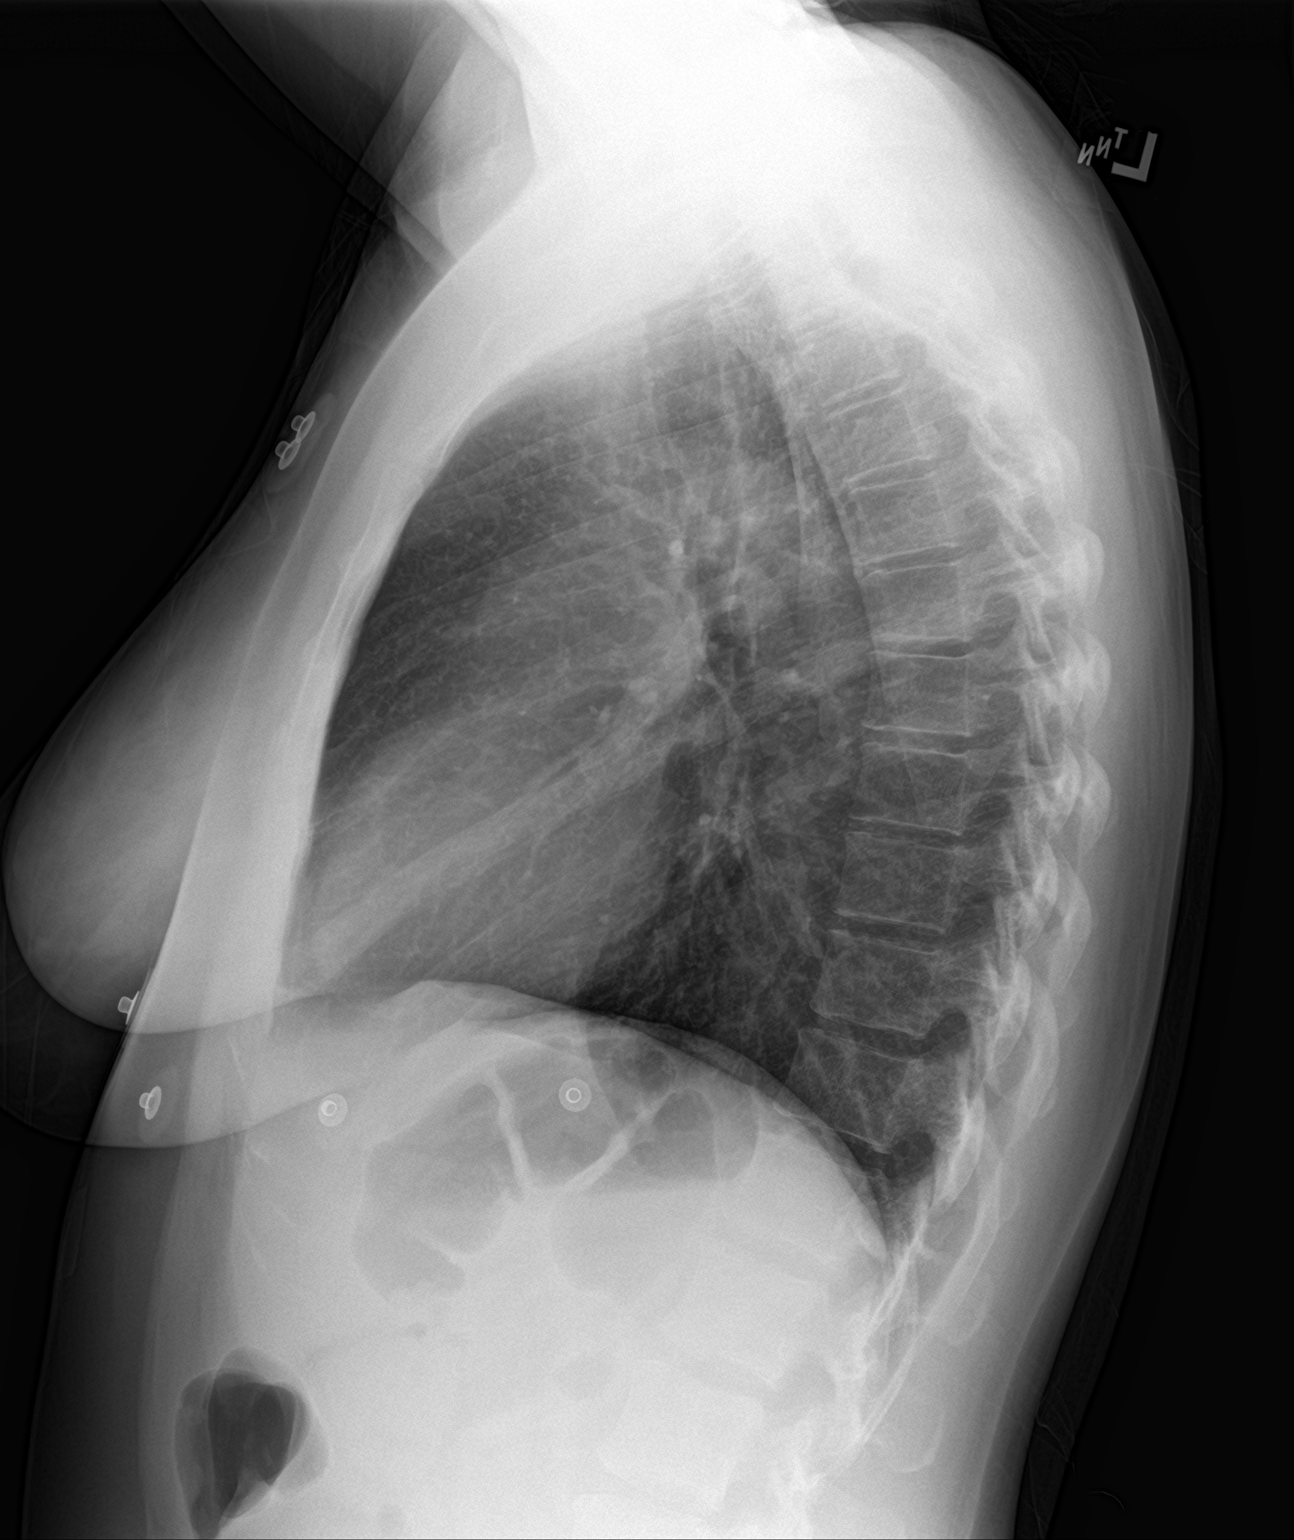

[2 of 2 positions shown; findings below may reference images not displayed]

FINDINGS: The heart size and mediastinal contours are within normal limits.
Both lungs are clear. No pleural effusion or pneumothorax. The
visualized skeletal structures are unremarkable.
IMPRESSION: No acute process in the chest.

## 2022-02-20 ENCOUNTER — Emergency Department: Payer: Medicaid Other

## 2022-02-20 ENCOUNTER — Other Ambulatory Visit: Payer: Self-pay

## 2022-02-20 ENCOUNTER — Emergency Department
Admission: EM | Admit: 2022-02-20 | Discharge: 2022-02-21 | Disposition: A | Discharge status: Home / Self Care | Payer: Medicaid Other | Attending: Emergency Medicine | Admitting: Emergency Medicine

## 2022-02-20 DIAGNOSIS — K529 Noninfective gastroenteritis and colitis, unspecified: Secondary | ICD-10-CM | POA: Diagnosis not present

## 2022-02-20 DIAGNOSIS — R1032 Left lower quadrant pain: Secondary | ICD-10-CM | POA: Diagnosis present

## 2022-02-20 DIAGNOSIS — F17200 Nicotine dependence, unspecified, uncomplicated: Secondary | ICD-10-CM | POA: Diagnosis not present

## 2022-02-20 DIAGNOSIS — D72829 Elevated white blood cell count, unspecified: Secondary | ICD-10-CM | POA: Insufficient documentation

## 2022-02-20 DIAGNOSIS — R1084 Generalized abdominal pain: Secondary | ICD-10-CM

## 2022-02-20 DIAGNOSIS — I10 Essential (primary) hypertension: Secondary | ICD-10-CM | POA: Diagnosis not present

## 2022-02-20 LAB — COMPREHENSIVE METABOLIC PANEL
ALT: 19 U/L (ref 0–44)
AST: 19 U/L (ref 15–41)
Albumin: 4.1 g/dL (ref 3.5–5.0)
Alkaline Phosphatase: 62 U/L (ref 38–126)
Anion gap: 10 (ref 5–15)
BUN: 8 mg/dL (ref 6–20)
CO2: 24 mmol/L (ref 22–32)
Calcium: 9.2 mg/dL (ref 8.9–10.3)
Chloride: 103 mmol/L (ref 98–111)
Creatinine, Ser: 0.85 mg/dL (ref 0.44–1.00)
GFR, Estimated: >60 mL/min (ref >60)
Glucose, Bld: 101 mg/dL — ABNORMAL HIGH (ref 70–99)
Potassium: 3.7 mmol/L (ref 3.5–5.1)
Sodium: 137 mmol/L (ref 135–145)
Total Bilirubin: 0.7 mg/dL (ref 0.3–1.2)
Total Protein: 7.4 g/dL (ref 6.5–8.1)

## 2022-02-20 LAB — URINALYSIS, ROUTINE W REFLEX MICROSCOPIC
Bilirubin Urine: NEGATIVE
Glucose, UA: NEGATIVE mg/dL
Hgb urine dipstick: NEGATIVE
Ketones, ur: NEGATIVE mg/dL
Leukocytes,Ua: NEGATIVE
Nitrite: NEGATIVE
Protein, ur: NEGATIVE mg/dL
Specific Gravity, Urine: 1.002 — ABNORMAL LOW (ref 1.005–1.030)
pH: 7 (ref 5.0–8.0)

## 2022-02-20 LAB — TYPE AND SCREEN
ABO/RH(D): A POS
Antibody Screen: NEGATIVE

## 2022-02-20 LAB — CBC
HCT: 42.2 % (ref 36.0–46.0)
Hemoglobin: 14 g/dL (ref 12.0–15.0)
MCH: 31.8 pg (ref 26.0–34.0)
MCHC: 33.2 g/dL (ref 30.0–36.0)
MCV: 95.9 fL (ref 80.0–100.0)
Platelets: 420 10*3/uL — ABNORMAL HIGH (ref 150–400)
RBC: 4.4 MIL/uL (ref 3.87–5.11)
RDW: 13 % (ref 11.5–15.5)
WBC: 12.5 10*3/uL — ABNORMAL HIGH (ref 4.0–10.5)
nRBC: 0 % (ref 0.0–0.2)

## 2022-02-20 LAB — POC URINE PREG, ED: Preg Test, Ur: NEGATIVE

## 2022-02-20 MED ORDER — MORPHINE SULFATE (PF) 4 MG/ML IV SOLN
4.0000 mg | Freq: Once | INTRAVENOUS | Status: AC
  Administered 2022-02-20: 4 mg via INTRAVENOUS
  Filled 2022-02-20: qty 1

## 2022-02-20 MED ORDER — SODIUM CHLORIDE 0.9 % IV BOLUS
1000.0000 mL | Freq: Once | INTRAVENOUS | Status: AC
  Administered 2022-02-20: 1000 mL via INTRAVENOUS

## 2022-02-20 MED ORDER — ONDANSETRON HCL 4 MG/2ML IJ SOLN
4.0000 mg | Freq: Once | INTRAMUSCULAR | Status: AC
  Administered 2022-02-20: 4 mg via INTRAVENOUS
  Filled 2022-02-20: qty 2

## 2022-02-20 MED ORDER — IOHEXOL 300 MG/ML  SOLN
100.0000 mL | Freq: Once | INTRAMUSCULAR | Status: AC | PRN
  Administered 2022-02-21: 100 mL via INTRAVENOUS

## 2022-02-20 NOTE — ED Triage Notes (Signed)
Pt had abdominal pain that started yesterday. She had diarrhea yesterday with no blood in stool. Today pt has not been able to have a BM other than a small firm stool. When pt wiped she noticed blood. Each time pt has felt she needed to go since she has had no stool only a smear of blood.

## 2022-02-20 NOTE — ED Provider Notes (Signed)
[facility] Provider Note    Event Date/Time   First MD Initiated Contact with Patient 02/20/22 2330     (approximate)   History   Abdominal Pain   HPI  Paige Wade is a 46 y.o. female who presents to the ED from home with a chief complaint of abdominal pain.  Symptoms began yesterday with upper abdominal cramping, nausea, vomiting and diarrhea.  Today patient complains of mainly lower abdominal cramping, left > right.  Now having bright red blood per rectum and a tiny firm stool.  Denies fever, chills, chest pain, shortness of breath, dysuria.  No history of IBD or diverticulitis.  Remote history of IBS per patient.     Past Medical History  No past medical history on file.   Active Problem List   Patient Active Problem List   Diagnosis Date Noted  . H/O tubal ligation 2005 03/11/2020  . Smoker 03/11/2020  . Hypertension 03/11/2020     Past Surgical History   Past Surgical History:  Procedure Laterality Date  . CESAREAN SECTION       Home Medications   Prior to Admission medications   Not on File     Allergies  Patient has no known allergies.   Family History  No family history on file.   Physical Exam  Triage Vital Signs: ED Triage Vitals  Enc Vitals Group     BP 02/20/22 2146 (!) 169/109     Pulse Rate 02/20/22 2146 (!) 105     Resp 02/20/22 2146 16     Temp 02/20/22 2146 98.3 F (36.8 C)     Temp Source 02/20/22 2146 Oral     SpO2 02/20/22 2146 97 %     Weight 02/20/22 2146 163 lb (73.9 kg)     Height 02/20/22 2146 5\' 7"  (1.702 m)     Head Circumference --      Peak Flow --      Pain Score 02/20/22 2154 9     Pain Loc --      Pain Edu? --      Excl. in [place]? --     Updated Vital Signs: BP (!) 145/100   Pulse 81   Temp 98.3 F (36.8 C) (Oral)   Resp 16   Ht 5\' 7"  (1.702 m)   Wt 73.9 kg   LMP 02/13/2022 (Exact Date)   SpO2 99%   BMI 25.53 kg/m    General: Awake, no distress.  CV:  RRR.  Good  peripheral perfusion.  Resp:  Normal effort.  CTA B. Abd:  Mildly tender to lower abdomen, particularly left lower quadrant without rebound or guarding.  No distention.  Other:  No truncal vesicles.   ED Results / Procedures / Treatments  Labs (all labs ordered are listed, but only abnormal results are displayed) Labs Reviewed  COMPREHENSIVE METABOLIC PANEL - Abnormal; Notable for the following components:      Result Value   Glucose, Bld 101 (*)    All other components within normal limits  CBC - Abnormal; Notable for the following components:   WBC 12.5 (*)    Platelets 420 (*)    All other components within normal limits  URINALYSIS, ROUTINE W REFLEX MICROSCOPIC - Abnormal; Notable for the following components:   Color, Urine STRAW (*)    APPearance CLEAR (*)    Specific Gravity, Urine 1.002 (*)    All other components within normal limits  POC URINE PREG,  ED  TYPE AND SCREEN     EKG  None   RADIOLOGY I have independently visualized and interpreted patient's CT as well as noted the radiology interpretation:  CT abdomen/pelvis: Colitis of the mid to distal descending and sigmoid colon, no SBO, normal appendix  Official radiology report(s): CT Abdomen Pelvis W Contrast  Result Date: 02/21/2022 CLINICAL DATA:  Left lower quadrant abdominal pain. EXAM: CT ABDOMEN AND PELVIS WITH CONTRAST TECHNIQUE: Multidetector CT imaging of the abdomen and pelvis was performed using the standard protocol following bolus administration of intravenous contrast. RADIATION DOSE REDUCTION: This exam was performed according to the departmental dose-optimization program which includes automated exposure control, adjustment of the mA and/or kV according to patient size and/or use of iterative reconstruction technique. CONTRAST:  OMNIPAQUE IOHEXOL 300 MG/ML  SOLN COMPARISON:  None Available. FINDINGS: Lower chest: The visualized lung bases are clear. No intra-abdominal free air.  Small free  fluid in the pelvis. Hepatobiliary: No focal liver abnormality is seen. No gallstones, gallbladder wall thickening, or biliary dilatation. Pancreas: Unremarkable. No pancreatic ductal dilatation or surrounding inflammatory changes. Spleen: Normal in size without focal abnormality. Adrenals/Urinary Tract: The adrenal glands are unremarkable. There is no hydronephrosis on either side. The visualized ureters and urinary bladder appear unremarkable. Stomach/Bowel: There is inflammatory changes and thickening of the mid to distal descending as well as sigmoid colon consistent with colitis. No bowel obstruction. The appendix is normal. Vascular/Lymphatic: The abdominal aorta and IVC are unremarkable. No portal venous gas. There is no adenopathy. Reproductive: The uterus and ovaries are grossly unremarkable. Other: None Musculoskeletal: No acute or significant osseous findings. IMPRESSION: Colitis of the mid to distal descending and sigmoid colon. No bowel obstruction. Normal appendix. Electronically Signed   By: Elgie Collard M.D.   On: 02/21/2022 00:25     PROCEDURES:  Critical Care performed: No  .1-3 Lead EKG Interpretation  Performed by: Irean Hong, MD Authorized by: Irean Hong, MD     Interpretation: normal     ECG rate:  95   ECG rate assessment: normal     Rhythm: sinus rhythm     Ectopy: none     Conduction: normal   Comments:     Patient placed on cardiac monitor to evaluate for arrhythmias    MEDICATIONS ORDERED IN ED: Medications  piperacillin-tazobactam (ZOSYN) IVPB 3.375 g (has no administration in time range)  sodium chloride 0.9 % bolus 1,000 mL (1,000 mLs Intravenous New Bag/Given 02/20/22 2347)  ondansetron (ZOFRAN) injection 4 mg (4 mg Intravenous Given 02/20/22 2344)  morphine (PF) 4 MG/ML injection 4 mg (4 mg Intravenous Given 02/20/22 2345)  iohexol (OMNIPAQUE) 300 MG/ML solution 100 mL (100 mLs Intravenous Contrast Given 02/21/22 0008)     IMPRESSION / MDM /  ASSESSMENT AND PLAN / ED COURSE  I reviewed the triage vital signs and the nursing notes.                             46 year old female presenting with lower abdominal pain, nausea/vomiting/diarrhea. Differential diagnosis includes, but is not limited to, ovarian cyst, ovarian torsion, acute appendicitis, diverticulitis, urinary tract infection/pyelonephritis, endometriosis, bowel obstruction, colitis, renal colic, gastroenteritis, hernia, fibroids, endometriosis, pregnancy related pain including ectopic pregnancy, etc. I have personally reviewed patient's records and note she last had a GYN visit on 03/2020.  Patient's presentation is most consistent with acute presentation with potential threat to life or bodily function.  The  patient is on the cardiac monitor to evaluate for evidence of arrhythmia and/or significant heart rate changes.  Laboratory results demonstrate mild leukocytosis WBC 12.5, normal electrolytes and urine.  Will initiate IV fluid hydration, IV for Morphine for pain paired with IV Zofran for nausea.  We will proceed with CT abdomen/pelvis and reassess.  Clinical Course as of 02/21/22 0035  Sun Feb 21, 2022  0030 Pain somewhat improved.  Updated patient and spouse on CT imaging result consistent with colitis, no SBO or appendicitis.  Will dose IV Zosyn now and discharged home on Augmentin.  We will also provide as needed prescriptions for Percocet and Zofran.  Strict return precautions given.  Both verbalized understanding agree with plan of care. [JS]    Clinical Course User Index [JS] Irean Hong, MD     FINAL CLINICAL IMPRESSION(S) / ED DIAGNOSES   Final diagnoses:  Generalized abdominal pain  Colitis     Rx / DC Orders   ED Discharge Orders     None        Note:  This document was prepared using Dragon voice recognition software and may include unintentional dictation errors.   Irean Hong, MD 02/21/22 [phone]

## 2022-02-21 MED ORDER — OXYCODONE-ACETAMINOPHEN 5-325 MG PO TABS: 1.0000 | ORAL_TABLET | ORAL | 0 refills | Status: DC | PRN

## 2022-02-21 MED ORDER — OXYCODONE-ACETAMINOPHEN 5-325 MG PO TABS
1.0000 | ORAL_TABLET | Freq: Once | ORAL | Status: AC
  Administered 2022-02-21: 1 via ORAL
  Filled 2022-02-21: qty 1

## 2022-02-21 MED ORDER — AMOXICILLIN-POT CLAVULANATE 875-125 MG PO TABS: 1.0000 | ORAL_TABLET | Freq: Two times a day (BID) | ORAL | 0 refills | Status: DC

## 2022-02-21 MED ORDER — PIPERACILLIN-TAZOBACTAM 3.375 G IVPB 30 MIN
3.3750 g | Freq: Once | INTRAVENOUS | Status: AC
  Administered 2022-02-21: 3.375 g via INTRAVENOUS
  Filled 2022-02-21: qty 50

## 2022-02-21 MED ORDER — ONDANSETRON 4 MG PO TBDP: 4.0000 mg | ORAL_TABLET | Freq: Three times a day (TID) | ORAL | 0 refills | Status: DC | PRN

## 2022-02-21 NOTE — ED Notes (Signed)
Transported to CT 

## 2022-02-21 NOTE — Discharge Instructions (Signed)
1.  Take antibiotic as prescribed (Augmentin 875 mg twice daily x7 days). 2.  You may take pain and nausea medicines as needed (Percocet/Zofran #20). 3.  Eat a bland diet x1 week, then slowly advance diet as tolerated. 4.  Return to the ER for worsening symptoms, persistent vomiting, difficulty breathing or other concerns.

## 2023-05-04 ENCOUNTER — Ambulatory Visit (INDEPENDENT_AMBULATORY_CARE_PROVIDER_SITE_OTHER): Payer: Medicaid Other | Admitting: Family Medicine

## 2023-05-04 ENCOUNTER — Telehealth: Payer: Self-pay

## 2023-05-04 ENCOUNTER — Other Ambulatory Visit (HOSPITAL_COMMUNITY)
Admission: RE | Admit: 2023-05-04 | Discharge: 2023-05-04 | Disposition: A | Discharge status: Home / Self Care | Payer: Medicaid Other | Source: Ambulatory Visit | Attending: Family Medicine | Admitting: Family Medicine

## 2023-05-04 ENCOUNTER — Encounter: Payer: Self-pay | Admitting: Family Medicine

## 2023-05-04 VITALS — BP 131/87 | HR 81 | Ht 67.0 in | Wt 168.8 lb

## 2023-05-04 DIAGNOSIS — Z124 Encounter for screening for malignant neoplasm of cervix: Secondary | ICD-10-CM | POA: Diagnosis present

## 2023-05-04 DIAGNOSIS — Z1159 Encounter for screening for other viral diseases: Secondary | ICD-10-CM

## 2023-05-04 DIAGNOSIS — Z1211 Encounter for screening for malignant neoplasm of colon: Secondary | ICD-10-CM | POA: Diagnosis not present

## 2023-05-04 DIAGNOSIS — Z1231 Encounter for screening mammogram for malignant neoplasm of breast: Secondary | ICD-10-CM | POA: Insufficient documentation

## 2023-05-04 DIAGNOSIS — N959 Unspecified menopausal and perimenopausal disorder: Secondary | ICD-10-CM

## 2023-05-04 DIAGNOSIS — Z7689 Persons encountering health services in other specified circumstances: Secondary | ICD-10-CM

## 2023-05-04 DIAGNOSIS — F17201 Nicotine dependence, unspecified, in remission: Secondary | ICD-10-CM | POA: Diagnosis not present

## 2023-05-04 DIAGNOSIS — Z0000 Encounter for general adult medical examination without abnormal findings: Secondary | ICD-10-CM | POA: Diagnosis not present

## 2023-05-04 DIAGNOSIS — G4726 Circadian rhythm sleep disorder, shift work type: Secondary | ICD-10-CM

## 2023-05-04 DIAGNOSIS — Z114 Encounter for screening for human immunodeficiency virus [HIV]: Secondary | ICD-10-CM

## 2023-05-04 DIAGNOSIS — I10 Essential (primary) hypertension: Secondary | ICD-10-CM | POA: Diagnosis not present

## 2023-05-04 MED ORDER — NORTRIPTYLINE HCL 10 MG PO CAPS: 10.0000 mg | ORAL_CAPSULE | Freq: Every day | ORAL | 0 refills | Status: DC

## 2023-05-04 MED ORDER — TRAZODONE HCL 50 MG PO TABS: 25.0000 mg | ORAL_TABLET | Freq: Every evening | ORAL | 0 refills | Status: DC | PRN

## 2023-05-04 NOTE — Assessment & Plan Note (Signed)
Low risk screen ?Consented; encouraged to "know your status" ?Recommend repeat screen if risk factors change ? ?

## 2023-05-04 NOTE — Assessment & Plan Note (Signed)
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

## 2023-05-04 NOTE — Assessment & Plan Note (Signed)
Denies known STI exposure or abnormal PAP; continues to have menstrual cycles

## 2023-05-04 NOTE — Progress Notes (Signed)
New patient visit   Patient: Paige Wade   DOB: 15-Nov-1975   47 y.o. Female  MRN: [id number] Visit Date: 05/04/2023  Today's healthcare provider: Jacky Kindle, FNP   Chief Complaint  Patient presents with   New Patient (Initial Visit)    Patient would like to have blood work, colonoscopy, and discuss menopause   Subjective    Paige Wade is a 47 y.o. female who presents today as a new patient to establish care.  HPI HPI     New Patient (Initial Visit)    Additional comments: Patient would like to have blood work, colonoscopy, and discuss menopause      Last edited by Acey Lav, CMA on 05/04/2023 10:21 AM.       History reviewed. No pertinent past medical history. Past Surgical History:  Procedure Laterality Date   CESAREAN SECTION     TUBAL LIGATION     Family Status  Relation Name Status   Mother  (Not Specified)   Father  Deceased  No partnership data on file   Family History  Problem Relation Age of Onset   Hypertension Mother    Hypertension Father    Kidney failure Father 77 - 61   Social History   Socioeconomic History   Marital status: Married    Spouse name: Not on file   Number of children: Not on file   Years of education: Not on file   Highest education level: Not on file  Occupational History   Not on file  Tobacco Use   Smoking status: Former    Current packs/day: 0.00    Average packs/day: 1 pack/day for 25.0 years (25.0 ttl pk-yrs)    Types: Cigarettes    Quit date: 05/08/1996    Years since quitting: 27.0   Smokeless tobacco: Never  Vaping Use   Vaping status: Never Used  Substance and Sexual Activity   Alcohol use: Never   Drug use: Never   Sexual activity: Not on file  Other Topics Concern   Not on file  Social History Narrative   Not on file   Social Determinants of Health   Financial Resource Strain: Not on file  Food Insecurity: Not on file  Transportation Needs: Not on file  Physical Activity: Not  on file  Stress: Not on file  Social Connections: Not on file   Outpatient Medications Prior to Visit  Medication Sig   [DISCONTINUED] amoxicillin-clavulanate (AUGMENTIN) 875-125 MG tablet Take 1 tablet by mouth 2 (two) times daily.   [DISCONTINUED] ondansetron (ZOFRAN-ODT) 4 MG disintegrating tablet Take 1 tablet (4 mg total) by mouth every 8 (eight) hours as needed for nausea or vomiting.   [DISCONTINUED] oxyCODONE-acetaminophen (PERCOCET/ROXICET) 5-325 MG tablet Take 1 tablet by mouth every 4 (four) hours as needed for severe pain.   No facility-administered medications prior to visit.   No Known Allergies  There is no immunization history on file for this patient.  Health Maintenance  Topic Date Due   HIV Screening  Never done   Hepatitis C Screening  Never done   DTaP/Tdap/Td (1 - Tdap) Never done   Cervical Cancer Screening (HPV/Pap Cotest)  Never done   Colonoscopy  Never done   COVID-19 Vaccine (1 - 2023-24 season) Never done   INFLUENZA VACCINE  10/03/2023 (Originally 02/03/2023)   HPV VACCINES  Aged Out   Patient Care Team: Jacky Kindle, FNP as PCP - General (Family Medicine)   Objective  BP 131/87 (BP Location: Right Arm, Patient Position: Sitting, Cuff Size: Normal)   Pulse 81   Ht 5\' 7"  (1.702 m)   Wt 168 lb 12.8 oz (76.6 kg)   LMP 04/25/2023   SpO2 100%   BMI 26.44 kg/m   Physical Exam Vitals and nursing note reviewed.  Constitutional:      General: She is awake. She is not in acute distress.    Appearance: Normal appearance. She is well-developed, well-groomed and overweight. She is not ill-appearing, toxic-appearing or diaphoretic.  HENT:     Head: Normocephalic and atraumatic.     Jaw: There is normal jaw occlusion. No trismus, tenderness, swelling or pain on movement.     Right Ear: Hearing, tympanic membrane, ear canal and external ear normal. There is no impacted cerumen.     Left Ear: Hearing, tympanic membrane, ear canal and external ear  normal. There is no impacted cerumen.     Nose: Nose normal. No congestion or rhinorrhea.     Right Turbinates: Not enlarged, swollen or pale.     Left Turbinates: Not enlarged, swollen or pale.     Right Sinus: No maxillary sinus tenderness or frontal sinus tenderness.     Left Sinus: No maxillary sinus tenderness or frontal sinus tenderness.     Mouth/Throat:     Lips: Pink.     Mouth: Mucous membranes are moist. No injury.     Tongue: No lesions.     Pharynx: Oropharynx is clear. Uvula midline. No pharyngeal swelling, oropharyngeal exudate, posterior oropharyngeal erythema or uvula swelling.     Tonsils: No tonsillar exudate or tonsillar abscesses.  Eyes:     General: Lids are normal. Lids are everted, no foreign bodies appreciated. Vision grossly intact. Gaze aligned appropriately. No allergic shiner or visual field deficit.       Right eye: No discharge.        Left eye: No discharge.     Extraocular Movements: Extraocular movements intact.     Conjunctiva/sclera: Conjunctivae normal.     Right eye: Right conjunctiva is not injected. No exudate.    Left eye: Left conjunctiva is not injected. No exudate.    Pupils: Pupils are equal, round, and reactive to light.  Neck:     Thyroid: No thyroid mass, thyromegaly or thyroid tenderness.     Vascular: No carotid bruit.     Trachea: Trachea normal.  Cardiovascular:     Rate and Rhythm: Normal rate and regular rhythm.     Pulses: Normal pulses.          Carotid pulses are 2+ on the right side and 2+ on the left side.      Radial pulses are 2+ on the right side and 2+ on the left side.       Dorsalis pedis pulses are 2+ on the right side and 2+ on the left side.       Posterior tibial pulses are 2+ on the right side and 2+ on the left side.     Heart sounds: Normal heart sounds, S1 normal and S2 normal. No murmur heard.    No friction rub. No gallop.  Pulmonary:     Effort: Pulmonary effort is normal. No respiratory distress.      Breath sounds: Normal breath sounds and air entry. No stridor. No wheezing, rhonchi or rales.  Chest:     Chest wall: No tenderness.  Abdominal:     General: Abdomen is flat. Bowel sounds  are normal. There is no distension.     Palpations: Abdomen is soft. There is no mass.     Tenderness: There is no abdominal tenderness. There is no right CVA tenderness, left CVA tenderness, guarding or rebound.     Hernia: No hernia is present.  Genitourinary:    Comments: Exam deferred; denies complaints Musculoskeletal:        General: No swelling, tenderness, deformity or signs of injury. Normal range of motion.     Cervical back: Full passive range of motion without pain, normal range of motion and neck supple. No edema, rigidity or tenderness. No muscular tenderness.     Right lower leg: No edema.     Left lower leg: No edema.  Lymphadenopathy:     Cervical: No cervical adenopathy.     Right cervical: No superficial, deep or posterior cervical adenopathy.    Left cervical: No superficial, deep or posterior cervical adenopathy.  Skin:    General: Skin is warm and dry.     Capillary Refill: Capillary refill takes less than 2 seconds.     Coloration: Skin is not jaundiced or pale.     Findings: No bruising, erythema, lesion or rash.  Neurological:     General: No focal deficit present.     Mental Status: She is alert and oriented to person, place, and time. Mental status is at baseline.     GCS: GCS eye subscore is 4. GCS verbal subscore is 5. GCS motor subscore is 6.     Sensory: Sensation is intact. No sensory deficit.     Motor: Motor function is intact. No weakness.     Coordination: Coordination is intact. Coordination normal.     Gait: Gait is intact. Gait normal.  Psychiatric:        Attention and Perception: Attention and perception normal.        Mood and Affect: Mood and affect normal.        Speech: Speech normal.        Behavior: Behavior normal. Behavior is cooperative.         Thought Content: Thought content normal.        Cognition and Memory: Cognition and memory normal.        Judgment: Judgment normal.    Depression Screen    05/04/2023   10:28 AM  PHQ 2/9 Scores  PHQ - 2 Score 0   No results found for any visits on 05/04/23.  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  - Eat a low-fat diet with lots of fruits and vegetables, up to 7-9 servings per day.  - Seatbelts can save your life. Wear them always.  - Smoke detectors on every level of your home, check batteries every year.  - Eye Doctor - have an  eye exam every 1-2 years  - Safe sex - if you may be exposed to STDs, use a condom.  - Alcohol -  If you drink, do it moderately, less than 2 drinks per day.  - Health Care Power of Attorney. Choose someone to speak for you if you are not able.  - Depression is common in our stressful world.If you're feeling down or losing interest in things you normally enjoy, please come in for a visit.  - Violence - If anyone is threatening or hurting you, please call immediately.       Relevant Orders   CBC with Differential/Platelet   Comprehensive Metabolic Panel (CMET)   TSH+T4F+T3Free   Hemoglobin A1c   Vitamin D (25 hydroxy)   Colon cancer screening    Due for colon cancer screening; reports concerns of "IBS" despite no previous diagnosis; known lactose  intolerance       Relevant Orders   Ambulatory referral to Gastroenterology   Encounter for hepatitis C screening test for low risk patient    Low risk screen Treatable, and curable. If left untreated Hep C can lead to cirrhosis and liver failure. Encourage routine testing; recommend repeat testing if risk factors change.       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 Defer use of HRT at this time given BP and recent tobacco hx      Relevant Orders   CBC with Differential/Platelet   Comprehensive Metabolic Panel (CMET)   TSH+T4F+T3Free   Hemoglobin A1c   Vitamin D (25 hydroxy)   Iron, TIBC and Ferritin Panel   B12 and Folate Panel   FSH/LH   Estrogens, total   Estradiol   Testosterone,Free and Total   DHEA   Screening for cervical cancer    Denies known STI exposure or abnormal PAP; continues to have menstrual cycles       Relevant Orders   Cytology - PAP   Screening mammogram for breast cancer    Due for screening for mammogram, denies breast concerns, provided with phone number to call and schedule appointment for mammogram. Encouraged to repeat breast cancer screening every 1-2 years.       Relevant Orders   MM 3D SCREENING MAMMOGRAM BILATERAL BREAST   Shift work sleep disorder    Trial of nortriptyline and trazodone       Relevant Medications   nortriptyline (PAMELOR) 10  MG capsule   traZODone (DESYREL) 50 MG tablet   Return in about 6 months (around 11/02/2023), or if symptoms worsen or fail to improve, for HTN management.    Leilani Merl, FNP, have reviewed all documentation for this visit. The documentation on 05/04/23 for the exam, diagnosis, procedures, and orders are all accurate and complete.  Jacky Kindle, FNP  [place]  [facility] [phone] (phone) [phone] (fax)  [facility]

## 2023-05-04 NOTE — Assessment & Plan Note (Signed)
No preventive care in many years Discussed early detection as well as prevention tests/screens/annual labs

## 2023-05-04 NOTE — Assessment & Plan Note (Signed)
Trial of nortriptyline and trazodone

## 2023-05-04 NOTE — Assessment & Plan Note (Signed)
Will check labs today to assist with symptom relief; may benefit from use of paxil or similar. Defer use of HRT at this time given BP and recent tobacco hx

## 2023-05-04 NOTE — Telephone Encounter (Signed)
Patient called in to schedule her colonoscopy.

## 2023-05-04 NOTE — Assessment & Plan Note (Signed)
Due for colon cancer screening; reports concerns of "IBS" despite no previous diagnosis; known lactose intolerance

## 2023-05-04 NOTE — Assessment & Plan Note (Signed)
Quit smoking 4 months ago; hx of 1ppd x 27 years

## 2023-05-04 NOTE — Patient Instructions (Signed)
Please call and schedule your mammogram:  [facility]  [address], [address] [facility] [place],  [place]  [address] Get Driving Directions Main: [phone]  Sunday:Closed Monday:7:20 AM - 5:00 PM Tuesday:7:20 AM - 5:00 PM Wednesday:7:20 AM - 5:00 PM Thursday:7:20 AM - 5:00 PM Friday:7:20 AM - 4:30 PM Saturday:Closed  

## 2023-05-04 NOTE — Assessment & Plan Note (Signed)
Low risk screen Treatable, and curable. If left untreated Hep C can lead to cirrhosis and liver failure. Encourage routine testing; recommend repeat testing if risk factors change.  

## 2023-05-04 NOTE — Assessment & Plan Note (Signed)
Due for screening for mammogram, denies breast concerns, provided with phone number to call and schedule appointment for mammogram. Encouraged to repeat breast cancer screening every 1-2 years.  

## 2023-05-04 NOTE — Assessment & Plan Note (Signed)
Recommend annual dental and biannual vision Things to do to keep yourself healthy  - Exercise at least 30-45 minutes a day, 3-4 days a week.  - Eat a low-fat diet with lots of fruits and vegetables, up to 7-9 servings per day.  - Seatbelts can save your life. Wear them always.  - Smoke detectors on every level of your home, check batteries every year.  - Eye Doctor - have an eye exam every 1-2 years  - Safe sex - if you may be exposed to STDs, use a condom.  - Alcohol -  If you drink, do it moderately, less than 2 drinks per day.  - Health Care Power of Attorney. Choose someone to speak for you if you are not able.  - Depression is common in our stressful world.If you're feeling down or losing interest in things you normally enjoy, please come in for a visit.  - Violence - If anyone is threatening or hurting you, please call immediately.

## 2023-05-05 NOTE — Telephone Encounter (Signed)
Message left for patient to return my call.  

## 2023-05-05 NOTE — Progress Notes (Signed)
Labs have been reviewed -low levels of Vitamin D noted; recommend either 5000 IU Vit D3 daily or 50,000 IU once weekly (rx.) -blood sugar average shows pre-diabetic range; Continue to recommend balanced, lower carb meals. Smaller meal size, adding snacks. Choosing water as drink of choice and increasing purposeful exercise. Recommend 3 month follow up to repeat labs -peri-menopausal symptoms; continue to monitor symptoms; full hormone results have not returned.

## 2023-05-06 ENCOUNTER — Telehealth: Payer: Self-pay | Admitting: Family Medicine

## 2023-05-06 ENCOUNTER — Other Ambulatory Visit: Payer: Self-pay

## 2023-05-06 LAB — CYTOLOGY - PAP
Chlamydia: NEGATIVE
Comment: NEGATIVE
Comment: NEGATIVE
Comment: NEGATIVE
Comment: NORMAL
Diagnosis: NEGATIVE
High risk HPV: NEGATIVE
Neisseria Gonorrhea: NEGATIVE
Trichomonas: NEGATIVE

## 2023-05-06 MED ORDER — VITAMIN D (ERGOCALCIFEROL) 1.25 MG (50000 UNIT) PO CAPS: 50000.0000 [IU] | ORAL_CAPSULE | ORAL | 0 refills | Status: DC

## 2023-05-06 NOTE — Telephone Encounter (Signed)
Pt called in checking status of med, Vitamin D, Ergocalciferol, (DRISDOL) 1.25 MG (50000 UNIT) CAPS capsule , it shows at pharmacy 8:30 this morning. I tried to connect pt with pharmacy to see if they have it and its no answer.

## 2023-05-09 ENCOUNTER — Other Ambulatory Visit: Payer: Self-pay | Admitting: *Deleted

## 2023-05-09 ENCOUNTER — Telehealth: Payer: Self-pay | Admitting: *Deleted

## 2023-05-09 ENCOUNTER — Telehealth: Payer: Self-pay

## 2023-05-09 DIAGNOSIS — Z1211 Encounter for screening for malignant neoplasm of colon: Secondary | ICD-10-CM

## 2023-05-09 MED ORDER — NA SULFATE-K SULFATE-MG SULF 17.5-3.13-1.6 GM/177ML PO SOLN: 1.0000 | Freq: Once | ORAL | 0 refills | Status: AC

## 2023-05-09 NOTE — Telephone Encounter (Signed)
Per [facility] pt insurance will end as of 06-04-2023. Per pt she will have new insurance by 06-05-2023. Pt states medicaid is in the process now . I ask pt to call me . Per pt  will call  [phone] to give me new insurance information as soon as she receives it.

## 2023-05-09 NOTE — Telephone Encounter (Signed)
Gastroenterology Pre-Procedure Review  Request Date: 06/17/2023 Requesting Physician: Dr. Allegra Lai  PATIENT REVIEW QUESTIONS: The patient responded to the following health history questions as indicated:    1. Are you having any GI issues? yes (IBS, bloated, gassy) 2. Do you have a personal history of Polyps? no 3. Do you have a family history of Colon Cancer or Polyps? no 4. Diabetes Mellitus? no 5. Joint replacements in the past 12 months?no 6. Major health problems in the past 3 months?no 7. Any artificial heart valves, MVP, or defibrillator?no    MEDICATIONS & ALLERGIES:    Patient reports the following regarding taking any anticoagulation/antiplatelet therapy:   Plavix, Coumadin, Eliquis, Xarelto, Lovenox, Pradaxa, Brilinta, or Effient? no Aspirin? no  Patient confirms/reports the following medications:  Current Outpatient Medications  Medication Sig Dispense Refill   Na Sulfate-K Sulfate-Mg Sulf 17.5-3.13-1.6 GM/177ML SOLN Take 1 kit by mouth once for 1 dose. 354 mL 0   nortriptyline (PAMELOR) 10 MG capsule Take 1 capsule (10 mg total) by mouth at bedtime. 90 capsule 0   traZODone (DESYREL) 50 MG tablet Take 0.5-1 tablets (25-50 mg total) by mouth at bedtime as needed for sleep. 90 tablet 0   Vitamin D, Ergocalciferol, (DRISDOL) 1.25 MG (50000 UNIT) CAPS capsule Take 1 capsule (50,000 Units total) by mouth every 7 (seven) days. 5 capsule 0   No current facility-administered medications for this visit.    Patient confirms/reports the following allergies:  No Known Allergies  No orders of the defined types were placed in this encounter.   AUTHORIZATION INFORMATION Primary Insurance: 1D#: Group #:  Secondary Insurance: 1D#: Group #:  SCHEDULE INFORMATION: Date: 06/17/2023 Time: Location:  [facility]

## 2023-05-09 NOTE — Telephone Encounter (Signed)
Patient called back. Colonoscopy schedule with Dr Allegra Lai  on 06/17/2023

## 2023-05-10 LAB — COMPREHENSIVE METABOLIC PANEL
ALT: 13 IU/L (ref 0–32)
AST: 12 IU/L (ref 0–40)
Albumin: 4.3 g/dL (ref 3.9–4.9)
Alkaline Phosphatase: 53 [IU]/L (ref 44–121)
BUN/Creatinine Ratio: 24 — ABNORMAL HIGH (ref 9–23)
BUN: 17 mg/dL (ref 6–24)
Bilirubin Total: <0.2 mg/dL (ref 0.0–1.2)
CO2: 23 mmol/L (ref 20–29)
Calcium: 9.6 mg/dL (ref 8.7–10.2)
Chloride: 105 mmol/L (ref 96–106)
Creatinine, Ser: 0.72 mg/dL (ref 0.57–1.00)
Globulin, Total: 2.1 g/dL (ref 1.5–4.5)
Glucose: 97 mg/dL (ref 70–99)
Potassium: 5.1 mmol/L (ref 3.5–5.2)
Sodium: 141 mmol/L (ref 134–144)
Total Protein: 6.4 g/dL (ref 6.0–8.5)
eGFR: 104 mL/min/{1.73_m2} (ref >59)

## 2023-05-10 LAB — TSH+T4F+T3FREE
Free T4: 1.22 ng/dL (ref 0.82–1.77)
T3, Free: 3.7 pg/mL (ref 2.0–4.4)
TSH: 3.14 u[IU]/mL (ref 0.450–4.500)

## 2023-05-10 LAB — TESTOSTERONE,FREE AND TOTAL
Testosterone, Free: 0.7 pg/mL (ref 0.0–4.2)
Testosterone: 15 ng/dL (ref 4–50)

## 2023-05-10 LAB — CBC WITH DIFFERENTIAL/PLATELET
Basophils Absolute: 0 10*3/uL (ref 0.0–0.2)
Basos: 1 %
EOS (ABSOLUTE): 0.3 10*3/uL (ref 0.0–0.4)
Eos: 5 %
Hematocrit: 39 % (ref 34.0–46.6)
Hemoglobin: 12.8 g/dL (ref 11.1–15.9)
Immature Grans (Abs): 0 10*3/uL (ref 0.0–0.1)
Immature Granulocytes: 0 %
Lymphocytes Absolute: 3.4 10*3/uL — ABNORMAL HIGH (ref 0.7–3.1)
Lymphs: 54 %
MCH: 31.2 pg (ref 26.6–33.0)
MCHC: 32.8 g/dL (ref 31.5–35.7)
MCV: 95 fL (ref 79–97)
Monocytes Absolute: 0.5 10*3/uL (ref 0.1–0.9)
Monocytes: 8 %
Neutrophils Absolute: 2 10*3/uL (ref 1.4–7.0)
Neutrophils: 32 %
Platelets: 405 10*3/uL (ref 150–450)
RBC: 4.1 x10E6/uL (ref 3.77–5.28)
RDW: 11.9 % (ref 11.7–15.4)
WBC: 6.3 10*3/uL (ref 3.4–10.8)

## 2023-05-10 LAB — ESTROGENS, TOTAL: Estrogen: 114 pg/mL

## 2023-05-10 LAB — IRON,TIBC AND FERRITIN PANEL
Ferritin: 20 ng/mL (ref 15–150)
Iron Saturation: 20 % (ref 15–55)
Iron: 58 ug/dL (ref 27–159)
Total Iron Binding Capacity: 283 ug/dL (ref 250–450)
UIBC: 225 ug/dL (ref 131–425)

## 2023-05-10 LAB — B12 AND FOLATE PANEL
Folate: 14.8 ng/mL (ref >3.0)
Vitamin B-12: 814 pg/mL (ref 232–1245)

## 2023-05-10 LAB — FSH/LH
FSH: 13.7 m[IU]/mL
LH: 13.4 m[IU]/mL

## 2023-05-10 LAB — HEPATITIS C ANTIBODY: Hep C Virus Ab: NONREACTIVE

## 2023-05-10 LAB — ESTRADIOL: Estradiol: 60.6 pg/mL

## 2023-05-10 LAB — HEMOGLOBIN A1C
Est. average glucose Bld gHb Est-mCnc: 120 mg/dL
Hgb A1c MFr Bld: 5.8 % — ABNORMAL HIGH (ref 4.8–5.6)

## 2023-05-10 LAB — VITAMIN D 25 HYDROXY (VIT D DEFICIENCY, FRACTURES): Vit D, 25-Hydroxy: 29.8 ng/mL — ABNORMAL LOW (ref 30.0–100.0)

## 2023-05-10 LAB — HIV ANTIBODY (ROUTINE TESTING W REFLEX): HIV Screen 4th Generation wRfx: NONREACTIVE

## 2023-05-10 LAB — DHEA: Dehydroepiandrosterone (DHEA): 198 ng/dL (ref 31–701)

## 2023-05-13 ENCOUNTER — Other Ambulatory Visit: Payer: Self-pay | Admitting: Medical Genetics

## 2023-05-13 DIAGNOSIS — Z006 Encounter for examination for normal comparison and control in clinical research program: Secondary | ICD-10-CM

## 2023-05-18 ENCOUNTER — Other Ambulatory Visit: Payer: Medicaid Other

## 2023-05-25 ENCOUNTER — Other Ambulatory Visit: Payer: Medicaid Other | Attending: Medical Genetics

## 2023-05-26 ENCOUNTER — Ambulatory Visit: Payer: Medicaid Other | Admitting: Family Medicine

## 2023-05-26 ENCOUNTER — Telehealth: Payer: Self-pay | Admitting: Family Medicine

## 2023-05-26 ENCOUNTER — Encounter: Payer: Self-pay | Admitting: Family Medicine

## 2023-05-26 VITALS — BP 143/84 | HR 80 | Resp 16 | Ht 67.0 in | Wt 172.0 lb

## 2023-05-26 DIAGNOSIS — J449 Chronic obstructive pulmonary disease, unspecified: Secondary | ICD-10-CM

## 2023-05-26 DIAGNOSIS — F17201 Nicotine dependence, unspecified, in remission: Secondary | ICD-10-CM | POA: Diagnosis not present

## 2023-05-26 DIAGNOSIS — Z9189 Other specified personal risk factors, not elsewhere classified: Secondary | ICD-10-CM

## 2023-05-26 MED ORDER — AMLODIPINE BESYLATE 5 MG PO TABS: 5.0000 mg | ORAL_TABLET | Freq: Every day | ORAL | 11 refills | Status: DC

## 2023-05-26 MED ORDER — ADVAIR DISKUS 100-50 MCG/ACT IN AEPB: 1.0000 | INHALATION_SPRAY | Freq: Two times a day (BID) | RESPIRATORY_TRACT | 11 refills | Status: DC

## 2023-05-26 NOTE — Telephone Encounter (Signed)
Recieved a fax from covermymeds for Fluticasone-Salmeterol 100-50MCG/ACT Aerosol Powder has been started.   key: [id number]

## 2023-05-26 NOTE — Progress Notes (Unsigned)
Established patient visit   Patient: Paige Wade   DOB: 1975/08/16   47 y.o. Female  MRN: [id number] Visit Date: 05/26/2023  Today's healthcare provider: Jacky Kindle, FNP  Re Introduced to nurse practitioner role and practice setting.  All questions answered.  Discussed provider/patient relationship and expectations.  Subjective    HPI   The patient, with a history of tobacco use, presents with difficulty using an interlock device due to shortness of breath. They report that the device requires a longer breath than they can provide, which has resulted in the device locking multiple times, incurring significant expense. They have been trying different techniques to meet the device's requirements, but continue to struggle.  The patient has recently joined a gym and has been using the treadmill for 30 minutes a day for the past four to five days. However, they report feeling out of breath after climbing stairs and during certain phases of their workout. They also note that they are often out of breath during their physically demanding job.  The patient quit smoking five months ago, after smoking approximately a pack a day since their mid-twenties. They report that their two non-smoking children struggled to use the interlock device, while their oldest child, who vapes, was able to use it without difficulty.  The patient also reports that their blood pressure has been elevated, which they attribute to stress and lack of sleep. They express a desire to manage their blood pressure through lifestyle changes, such as improving their diet and sleep habits, rather than medication.  Medications: Outpatient Medications Prior to Visit  Medication Sig   nortriptyline (PAMELOR) 10 MG capsule Take 1 capsule (10 mg total) by mouth at bedtime.   traZODone (DESYREL) 50 MG tablet Take 0.5-1 tablets (25-50 mg total) by mouth at bedtime as needed for sleep.   Vitamin D, Ergocalciferol, (DRISDOL)  1.25 MG (50000 UNIT) CAPS capsule Take 1 capsule (50,000 Units total) by mouth every 7 (seven) days.   No facility-administered medications prior to visit.    Review of Systems Last CBC Lab Results  Component Value Date   WBC 6.3 05/04/2023   HGB 12.8 05/04/2023   HCT 39.0 05/04/2023   MCV 95 05/04/2023   MCH 31.2 05/04/2023   RDW 11.9 05/04/2023   PLT 405 05/04/2023   Last metabolic panel Lab Results  Component Value Date   GLUCOSE 97 05/04/2023   NA 141 05/04/2023   K 5.1 05/04/2023   CL 105 05/04/2023   CO2 23 05/04/2023   BUN 17 05/04/2023   CREATININE 0.72 05/04/2023   EGFR 104 05/04/2023   CALCIUM 9.6 05/04/2023   PROT 6.4 05/04/2023   ALBUMIN 4.3 05/04/2023   LABGLOB 2.1 05/04/2023   BILITOT <0.2 05/04/2023   ALKPHOS 53 05/04/2023   AST 12 05/04/2023   ALT 13 05/04/2023   ANIONGAP 10 02/20/2022   Last lipids No results found for: "CHOL", "HDL", "LDLCALC", "LDLDIRECT", "TRIG", "CHOLHDL" Last hemoglobin A1c Lab Results  Component Value Date   HGBA1C 5.8 (H) 05/04/2023   Last thyroid functions Lab Results  Component Value Date   TSH 3.140 05/04/2023     Objective    LMP 04/25/2023  BP Readings from Last 3 Encounters:  05/26/23 (!) 143/84  05/04/23 131/87  02/21/22 (!) 138/98   Wt Readings from Last 3 Encounters:  05/26/23 172 lb (78 kg)  05/04/23 168 lb 12.8 oz (76.6 kg)  02/20/22 163 lb (73.9 kg)   Physical  Exam Vitals and nursing note reviewed.  Constitutional:      General: She is not in acute distress.    Appearance: Normal appearance. She is well-developed and overweight. She is not ill-appearing, toxic-appearing or diaphoretic.  HENT:     Head: Normocephalic and atraumatic.  Cardiovascular:     Rate and Rhythm: Normal rate and regular rhythm.     Pulses: Normal pulses.     Heart sounds: Normal heart sounds. No murmur heard.    No friction rub. No gallop.  Pulmonary:     Effort: Pulmonary effort is normal. No respiratory distress.      Breath sounds: Normal breath sounds. No stridor. No wheezing, rhonchi or rales.  Chest:     Chest wall: No tenderness.  Musculoskeletal:        General: No swelling, tenderness, deformity or signs of injury. Normal range of motion.     Right lower leg: No edema.     Left lower leg: No edema.  Skin:    General: Skin is warm and dry.     Capillary Refill: Capillary refill takes less than 2 seconds.     Coloration: Skin is not jaundiced or pale.     Findings: No bruising, erythema, lesion or rash.  Neurological:     General: No focal deficit present.     Mental Status: She is alert and oriented to person, place, and time. Mental status is at baseline.     Cranial Nerves: No cranial nerve deficit.     Sensory: No sensory deficit.     Motor: No weakness.     Coordination: Coordination normal.  Psychiatric:        Mood and Affect: Mood normal.        Behavior: Behavior normal.        Thought Content: Thought content normal.        Judgment: Judgment normal.     No results found for any visits on 05/26/23.  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 Plan to evaluate lung function and consider medications to improve lung function. -Refer to pulmonology for lung function evaluation. -Start trial of once daily inhaler (Advair) to improve lung function. Medication Samples have been provided to the patient.  Drug name:  Trelegy 100; 2 box provided; once daily      The patient has been instructed regarding the correct time, dose, and frequency of taking this medication, including desired effects and most common side effects.   -Provide note for DMV regarding patient's difficulty with interlock device due to past tobacco use.  Hypertension Elevated blood pressure noted on multiple occasions, possibly related to stress. Discussed potential benefits of medication to lower blood pressure and improve stamina. -Start trial of once daily blood pressure medication. -Check blood pressure at follow-up visit in one week.  Exercise Recently started exercising at [employer], but experiencing  shortness of breath with exertion. -Encourage continued exercise at [employer]. -Consider potential benefits of blood pressure medication and inhaler on exercise tolerance.  Follow-up Plan to reassess blood pressure, lung function, and response to medications at follow-up visit. -Schedule follow-up visit for the week before Christmas.    Leilani Merl, FNP, have reviewed all documentation for this visit. The documentation on 05/29/23 for the exam, diagnosis, procedures, and orders are all accurate and complete.  Jacky Kindle, FNP  [facility] [phone] (phone) [phone] (fax)  [facility]

## 2023-05-27 NOTE — Telephone Encounter (Signed)
PA initiated and sent to [employer]

## 2023-05-29 MED ORDER — TRELEGY ELLIPTA 100-62.5-25 MCG/ACT IN AEPB: 1.0000 | INHALATION_SPRAY | Freq: Every day | RESPIRATORY_TRACT | Status: DC

## 2023-06-01 ENCOUNTER — Encounter: Payer: Self-pay | Admitting: Pulmonary Disease

## 2023-06-09 ENCOUNTER — Telehealth: Payer: Self-pay

## 2023-06-09 ENCOUNTER — Telehealth: Payer: Self-pay | Admitting: *Deleted

## 2023-06-09 NOTE — Telephone Encounter (Signed)
Spoken to patient and she have to reschedule because her job would not give her that time off, patient works 3rd shift.  Requesting to reschedule colonoscopy to 07/22/2023.  New instructions will be sent. Patient have already pickup her Suprep.

## 2023-06-09 NOTE — Telephone Encounter (Signed)
Colonoscopy reschedule to 07/22/2023

## 2023-06-09 NOTE — Telephone Encounter (Signed)
Copied from CRM [id number]. Topic: General - Inquiry >> Jun 09, 2023  9:48 AM Patsy Lager T wrote: Reason for CRM: patient called stated she is not able to get off work for her procedure on 12/13 as they are in peak season. She will call back after the holidays to reschedule

## 2023-06-09 NOTE — Telephone Encounter (Signed)
Pt called to cancel procedure for 06/17/2023 requesting call back to reschedule

## 2023-06-09 NOTE — Telephone Encounter (Signed)
Patient advised to contact Gastro office as they are the ones who control scheduling for her procedure. Patient verbalized understanding

## 2023-06-22 ENCOUNTER — Ambulatory Visit: Payer: Medicaid Other | Admitting: Pulmonary Disease

## 2023-06-22 ENCOUNTER — Encounter: Payer: Self-pay | Admitting: Pulmonary Disease

## 2023-06-22 VITALS — BP 120/60 | HR 99 | Temp 97.8°F | Ht 67.0 in | Wt 166.0 lb

## 2023-06-22 DIAGNOSIS — R0600 Dyspnea, unspecified: Secondary | ICD-10-CM | POA: Diagnosis not present

## 2023-06-22 NOTE — Progress Notes (Signed)
Synopsis: Referred in by Jacky Kindle, FNP   Subjective:   PATIENT ID: Paige Wade GENDER: female DOB: 06-18-1976, MRN: [id number]  Chief Complaint  Patient presents with   pulmonary consult    SOB with incline and wheezing.    HPI Ms. Orders is a 47 year old female patient with a past medical history of hypertension, presumed COPD on Trelegy presenting to the pulmonary clinic today to establish care.  She reports shortness of breath on exertion mostly going up the stairs. This started 4 months ago.She denies any chest tightness, wheezing, chest pain, cough or sputum production. Her mom passed away at 54 from a pulmonary heart disease? And this have her anxious.   She denies having the diagnosis of asthma. She denies any hypersensitivity to cold air, strong scents or humidity  Family history - Mom passed away at 30 of a cardiopulmonary diseaes ?   Social history - Ex smoker, quit in June 2024, smoked 1ppd for 25 years. She denies alcohol use, denies illicit drug use including marijuana. She works at [employer]. Has 1 cat and 1 dog. She has 3 kids at home who are healthy.    ROS All systems were reviewed and are negative except for the above. Objective:   Vitals:   06/22/23 1053  BP: 120/60  Pulse: 99  Temp: 97.8 F (36.6 C)  TempSrc: Temporal  SpO2: 99%  Weight: 166 lb (75.3 kg)  Height: 5\' 7"  (1.702 m)   99% on RA BMI Readings from Last 3 Encounters:  06/22/23 26.00 kg/m  05/26/23 26.94 kg/m  05/04/23 26.44 kg/m   Wt Readings from Last 3 Encounters:  06/22/23 166 lb (75.3 kg)  05/26/23 172 lb (78 kg)  05/04/23 168 lb 12.8 oz (76.6 kg)    Physical Exam GEN: NAD, Healthy Appearing HEENT: Supple Neck, Reactive Pupils, EOMI  CVS: Normal S1, Normal S2, RRR, No murmurs or ES appreciated  Lungs: Clear bilateral air entry.  Abdomen: Soft, non tender, non distended, + BS  Extremities: Warm and well perfused, No edema  Skin: No suspicious lesions  appreciated  Psych: Normal Affect  Labs and imaging were reviewed.  Ancillary Information   CBC    Component Value Date/Time   WBC 6.3 05/04/2023 1134   WBC 12.5 (H) 02/20/2022 2147   RBC 4.10 05/04/2023 1134   RBC 4.40 02/20/2022 2147   HGB 12.8 05/04/2023 1134   HCT 39.0 05/04/2023 1134   PLT 405 05/04/2023 1134   MCV 95 05/04/2023 1134   MCV 101 (H) 12/17/2011 0243   MCH 31.2 05/04/2023 1134   MCH 31.8 02/20/2022 2147   MCHC 32.8 05/04/2023 1134   MCHC 33.2 02/20/2022 2147   RDW 11.9 05/04/2023 1134   RDW 12.4 12/17/2011 0243   LYMPHSABS 3.4 (H) 05/04/2023 1134   MONOABS 0.4 07/12/2019 1555   EOSABS 0.3 05/04/2023 1134   BASOSABS 0.0 05/04/2023 1134        No data to display           Assessment & Plan:  Ms. Ralston is a 47 year old female patient with a past medical history of hypertension, presumed COPD on Trelegy presenting to the pulmonary clinic today to establish care.  #Dyspnea  Describe as mild, only happens when going up the stairs. She recently joined [employer] and is walking only on a daily basis. I doubt she has severe COPD and not sure when she was placed on Trelegy. One could  think of PH in 47 year old female with dyspnea.   []  PFTs  []  Echocardiogram  Return in about 3 months (around 09/20/2023).  I spent 60 minutes caring for this patient today, including preparing to see the patient, obtaining a medical history , reviewing a separately obtained history, performing a medically appropriate examination and/or evaluation, counseling and educating the patient/family/caregiver, ordering medications, tests, or procedures, documenting clinical information in the electronic health record, and independently interpreting results (not separately reported/billed) and communicating results to the patient/family/caregiver  Janann Colonel, MD [facility] 06/22/2023 12:30 PM

## 2023-06-23 ENCOUNTER — Telehealth: Payer: Self-pay | Admitting: Family Medicine

## 2023-06-23 ENCOUNTER — Ambulatory Visit: Payer: Medicaid Other | Admitting: Family Medicine

## 2023-06-23 VITALS — BP 122/76 | HR 67 | Ht 67.0 in | Wt 163.0 lb

## 2023-06-23 DIAGNOSIS — Z9189 Other specified personal risk factors, not elsewhere classified: Secondary | ICD-10-CM

## 2023-06-23 DIAGNOSIS — E559 Vitamin D deficiency, unspecified: Secondary | ICD-10-CM | POA: Insufficient documentation

## 2023-06-23 DIAGNOSIS — I10 Essential (primary) hypertension: Secondary | ICD-10-CM | POA: Diagnosis not present

## 2023-06-23 MED ORDER — VITAMIN D3 125 MCG (5000 UT) PO CAPS: 5000.0000 [IU] | ORAL_CAPSULE | Freq: Every day | ORAL | 1 refills | Status: DC

## 2023-06-23 MED ORDER — FLUTICASONE-SALMETEROL 100-50 MCG/ACT IN AEPB: 1.0000 | INHALATION_SPRAY | Freq: Two times a day (BID) | RESPIRATORY_TRACT | 11 refills | Status: DC

## 2023-06-23 NOTE — Progress Notes (Signed)
Established patient visit   Patient: Paige Wade   DOB: May 03, 1976   47 y.o. Female  MRN: [id number] Visit Date: 06/23/2023  Today's healthcare provider: Jacky Kindle, FNP  Introduced to nurse practitioner role and practice setting.  All questions answered.  Discussed provider/patient relationship and expectations.  Chief Complaint  Patient presents with   Follow-up    HTN-f/u--pt stated--stated b/p med possible causing nausea especially at night Vit.D   Subjective    HPI HPI     Follow-up    Additional comments: HTN-f/u--pt stated--stated b/p med possible causing nausea especially at night Vit.D      Last edited by Shelly Bombard, CMA on 06/23/2023 10:44 AM.      Medications: Outpatient Medications Prior to Visit  Medication Sig   amLODipine (NORVASC) 5 MG tablet Take 1 tablet (5 mg total) by mouth daily.   magnesium gluconate (MAGONATE) 500 MG tablet Take 500 mg by mouth 2 (two) times daily.   nortriptyline (PAMELOR) 10 MG capsule Take 1 capsule (10 mg total) by mouth at bedtime.   traZODone (DESYREL) 50 MG tablet Take 0.5-1 tablets (25-50 mg total) by mouth at bedtime as needed for sleep.   [DISCONTINUED] fluticasone-salmeterol (ADVAIR DISKUS) 100-50 MCG/ACT AEPB Inhale 1 puff into the lungs 2 (two) times daily.   [DISCONTINUED] Fluticasone-Umeclidin-Vilant (TRELEGY ELLIPTA) 100-62.5-25 MCG/ACT AEPB Inhale 1 puff into the lungs daily.   [DISCONTINUED] Vitamin D, Ergocalciferol, (DRISDOL) 1.25 MG (50000 UNIT) CAPS capsule Take 1 capsule (50,000 Units total) by mouth every 7 (seven) days.   No facility-administered medications prior to visit.   Last CBC Lab Results  Component Value Date   WBC 6.3 05/04/2023   HGB 12.8 05/04/2023   HCT 39.0 05/04/2023   MCV 95 05/04/2023   MCH 31.2 05/04/2023   RDW 11.9 05/04/2023   PLT 405 05/04/2023   Last metabolic panel Lab Results  Component Value Date   GLUCOSE 97 05/04/2023   NA 141 05/04/2023   K 5.1  05/04/2023   CL 105 05/04/2023   CO2 23 05/04/2023   BUN 17 05/04/2023   CREATININE 0.72 05/04/2023   EGFR 104 05/04/2023   CALCIUM 9.6 05/04/2023   PROT 6.4 05/04/2023   ALBUMIN 4.3 05/04/2023   LABGLOB 2.1 05/04/2023   BILITOT <0.2 05/04/2023   ALKPHOS 53 05/04/2023   AST 12 05/04/2023   ALT 13 05/04/2023   ANIONGAP 10 02/20/2022   Last lipids No results found for: "CHOL", "HDL", "LDLCALC", "LDLDIRECT", "TRIG", "CHOLHDL" Last hemoglobin A1c Lab Results  Component Value Date   HGBA1C 5.8 (H) 05/04/2023   Last thyroid functions Lab Results  Component Value Date   TSH 3.140 05/04/2023   Last vitamin D Lab Results  Component Value Date   VD25OH 29.8 (L) 05/04/2023   Last vitamin B12 and Folate Lab Results  Component Value Date   VITAMINB12 814 05/04/2023   FOLATE 14.8 05/04/2023     Objective    BP 122/76 (BP Location: Right Arm, Patient Position: Sitting, Cuff Size: Normal)   Pulse 67   Ht 5\' 7"  (1.702 m)   Wt 163 lb (73.9 kg)   BMI 25.53 kg/m   BP Readings from Last 3 Encounters:  06/23/23 122/76  06/22/23 120/60  05/26/23 (!) 143/84   Wt Readings from Last 3 Encounters:  06/23/23 163 lb (73.9 kg)  06/22/23 166 lb (75.3 kg)  05/26/23 172 lb (78 kg)   SpO2 Readings from Last 3 Encounters:  06/22/23 99%  05/26/23 99%  05/04/23 100%   Physical Exam Vitals and nursing note reviewed.  Constitutional:      General: She is not in acute distress.    Appearance: Normal appearance. She is overweight. She is not ill-appearing, toxic-appearing or diaphoretic.  HENT:     Head: Normocephalic and atraumatic.  Cardiovascular:     Rate and Rhythm: Normal rate and regular rhythm.     Pulses: Normal pulses.     Heart sounds: Normal heart sounds. No murmur heard.    No friction rub. No gallop.  Pulmonary:     Effort: Pulmonary effort is normal. No respiratory distress.     Breath sounds: Normal breath sounds. No stridor. No wheezing, rhonchi or rales.  Chest:      Chest wall: No tenderness.  Musculoskeletal:        General: No swelling, tenderness, deformity or signs of injury. Normal range of motion.     Right lower leg: No edema.     Left lower leg: No edema.  Skin:    General: Skin is warm and dry.     Capillary Refill: Capillary refill takes less than 2 seconds.     Coloration: Skin is not jaundiced or pale.     Findings: No bruising, erythema, lesion or rash.  Neurological:     General: No focal deficit present.     Mental Status: She is alert and oriented to person, place, and time. Mental status is at baseline.     Cranial Nerves: No cranial nerve deficit.     Sensory: No sensory deficit.     Motor: No weakness.     Coordination: Coordination normal.  Psychiatric:        Mood and Affect: Mood normal.        Behavior: Behavior normal.        Thought Content: Thought content normal.        Judgment: Judgment normal.     No results found for any visits on 06/23/23.  Assessment & Plan     Problem List Items Addressed This Visit       Cardiovascular and Mediastinum   Hypertension   Chronic, stable Advised that GI symptoms are not often associated with norvasc use; continue to monitor Advised to trial smaller meals, with balanced prot/fat/carbs and ensure hydration Consider w/u for GERD if ongoing symptoms continue Continue norvasc 5 mg for goal of 119/79         Other   At risk for ineffective pattern of breathing - Primary   Seen by pulm team; has not picked up daily inhaler to assist with possible COPD PFTs and ECHO pending Continue to monitor       Avitaminosis D   Chronic, low x 5 weeks of high dose treatment Encouraged to start 5000 IU Vit D 3 daily with meal for further assistance F/u as needed      No follow-ups on file.     Leilani Merl, FNP, have reviewed all documentation for this visit. The documentation on 06/23/23 for the exam, diagnosis, procedures, and orders are all accurate and  complete.  Jacky Kindle, FNP  [facility] [phone] (phone) [phone] (fax)  [facility]

## 2023-06-23 NOTE — Assessment & Plan Note (Signed)
Seen by pulm team; has not picked up daily inhaler to assist with possible COPD PFTs and ECHO pending Continue to monitor

## 2023-06-23 NOTE — Assessment & Plan Note (Signed)
Chronic, low x 5 weeks of high dose treatment Encouraged to start 5000 IU Vit D 3 daily with meal for further assistance F/u as needed

## 2023-06-23 NOTE — Assessment & Plan Note (Signed)
Chronic, stable Advised that GI symptoms are not often associated with norvasc use; continue to monitor Advised to trial smaller meals, with balanced prot/fat/carbs and ensure hydration Consider w/u for GERD if ongoing symptoms continue Continue norvasc 5 mg for goal of 119/79

## 2023-06-23 NOTE — Telephone Encounter (Signed)
Received fax from [employer] for Fluticasone-Salmeterol 100-50 mcg/act aerosol powder   Key:  [id number]

## 2023-06-24 NOTE — Telephone Encounter (Signed)
Archived on December 19 by [id number]

## 2023-07-07 ENCOUNTER — Ambulatory Visit
Admission: RE | Admit: 2023-07-07 | Discharge: 2023-07-07 | Disposition: A | Discharge status: Home / Self Care | Payer: Medicaid Other | Source: Ambulatory Visit | Attending: Family Medicine | Admitting: Family Medicine

## 2023-07-07 DIAGNOSIS — Z1231 Encounter for screening mammogram for malignant neoplasm of breast: Secondary | ICD-10-CM | POA: Diagnosis present

## 2023-07-11 ENCOUNTER — Other Ambulatory Visit: Payer: Self-pay | Admitting: Family Medicine

## 2023-07-11 DIAGNOSIS — R928 Other abnormal and inconclusive findings on diagnostic imaging of breast: Secondary | ICD-10-CM

## 2023-07-14 ENCOUNTER — Other Ambulatory Visit: Payer: Self-pay | Admitting: Family Medicine

## 2023-07-14 DIAGNOSIS — R928 Other abnormal and inconclusive findings on diagnostic imaging of breast: Secondary | ICD-10-CM

## 2023-07-14 NOTE — Telephone Encounter (Signed)
 Copied from CRM [id number]. Topic: General - Inquiry >> Jul 14, 2023 12:31 PM Donnal HERO wrote: Reason for CRM:Pt stated [facility] is waiting on an order for diagnosis on right breast. I advised the patient the order is in the system. She stated she was told it has to be signed off.  Please advise.

## 2023-07-22 ENCOUNTER — Other Ambulatory Visit: Payer: Self-pay

## 2023-07-22 ENCOUNTER — Ambulatory Visit: Payer: Medicaid Other | Admitting: Anesthesiology

## 2023-07-22 ENCOUNTER — Encounter
Admission: RE | Disposition: A | Discharge status: Home / Self Care | Payer: Self-pay | Source: Home / Self Care | Attending: Gastroenterology

## 2023-07-22 ENCOUNTER — Encounter: Payer: Self-pay | Admitting: Gastroenterology

## 2023-07-22 ENCOUNTER — Ambulatory Visit
Admission: RE | Admit: 2023-07-22 | Discharge: 2023-07-22 | Disposition: A | Discharge status: Home / Self Care | Payer: Medicaid Other | Attending: Gastroenterology | Admitting: Gastroenterology

## 2023-07-22 DIAGNOSIS — I10 Essential (primary) hypertension: Secondary | ICD-10-CM | POA: Insufficient documentation

## 2023-07-22 DIAGNOSIS — Z87891 Personal history of nicotine dependence: Secondary | ICD-10-CM | POA: Diagnosis not present

## 2023-07-22 DIAGNOSIS — Z1211 Encounter for screening for malignant neoplasm of colon: Secondary | ICD-10-CM | POA: Diagnosis present

## 2023-07-22 HISTORY — PX: COLONOSCOPY WITH PROPOFOL: SHX5780

## 2023-07-22 LAB — POCT PREGNANCY, URINE: Preg Test, Ur: NEGATIVE

## 2023-07-22 SURGERY — COLONOSCOPY WITH PROPOFOL
Anesthesia: General

## 2023-07-22 MED ORDER — SODIUM CHLORIDE 0.9 % IV SOLN
INTRAVENOUS | Status: DC
Start: 2023-07-22 — End: 2023-07-22

## 2023-07-22 MED ORDER — PROPOFOL 10 MG/ML IV BOLUS
INTRAVENOUS | Status: DC | PRN
  Administered 2023-07-22: 20 mg via INTRAVENOUS
  Administered 2023-07-22: 40 mg via INTRAVENOUS
  Administered 2023-07-22 (×7): 20 mg via INTRAVENOUS

## 2023-07-22 MED ORDER — LIDOCAINE HCL (PF) 1 % IJ SOLN
INTRAMUSCULAR | Status: DC | PRN
  Administered 2023-07-22: 40 mg

## 2023-07-22 NOTE — H&P (Signed)
Arlyss Repress, MD [address]  [address]  [place], [place] [address]  Main: [phone]  Fax: [phone] Pager: [phone]  Primary Care Physician:  Sherlyn Hay, DO Primary Gastroenterologist:  Dr. Arlyss Repress  Pre-Procedure History & Physical: HPI:  Paige Wade is a 48 y.o. female is here for an colonoscopy.   History reviewed. No pertinent past medical history.  Past Surgical History:  Procedure Laterality Date   CESAREAN SECTION     TUBAL LIGATION      Prior to Admission medications   Medication Sig Start Date End Date Taking? Authorizing Provider  amLODipine (NORVASC) 5 MG tablet Take 1 tablet (5 mg total) by mouth daily. 05/26/23   Jacky Kindle, FNP  Cholecalciferol (VITAMIN D3) 125 MCG (5000 UT) CAPS Take 1 capsule (5,000 Units total) by mouth daily. 06/23/23   Jacky Kindle, FNP  fluticasone-salmeterol (ADVAIR DISKUS) 100-50 MCG/ACT AEPB Inhale 1 puff into the lungs 2 (two) times daily. 06/23/23   Jacky Kindle, FNP  magnesium gluconate (MAGONATE) 500 MG tablet Take 500 mg by mouth 2 (two) times daily.    [provider]  nortriptyline (PAMELOR) 10 MG capsule Take 1 capsule (10 mg total) by mouth at bedtime. 05/04/23   Jacky Kindle, FNP  traZODone (DESYREL) 50 MG tablet Take 0.5-1 tablets (25-50 mg total) by mouth at bedtime as needed for sleep. 05/04/23   Jacky Kindle, FNP    Allergies as of 05/09/2023   (No Known Allergies)    Family History  Problem Relation Age of Onset   Hypertension Mother    Hypertension Father    Kidney failure Father 80 - 12    Social History   Socioeconomic History   Marital status: Married    Spouse name: Not on file   Number of children: Not on file   Years of education: Not on file   Highest education level: GED or equivalent  Occupational History   Not on file  Tobacco Use   Smoking status: Former    Current packs/day: 0.00    Types: Cigarettes    Quit date: 05/08/1996    Years  since quitting: 27.2   Smokeless tobacco: Never   Tobacco comments:    Quit 12/2022  Vaping Use   Vaping status: Never Used  Substance and Sexual Activity   Alcohol use: Never   Drug use: Never   Sexual activity: Not on file  Other Topics Concern   Not on file  Social History Narrative   Not on file   Social Drivers of Health   Financial Resource Strain: Low Risk  (06/21/2023)   Overall Financial Resource Strain (CARDIA)    Difficulty of Paying Living Expenses: Not hard at all  Food Insecurity: No Food Insecurity (06/21/2023)   Hunger Vital Sign    Worried About Running Out of Food in the Last Year: Never true    Ran Out of Food in the Last Year: Never true  Transportation Needs: No Transportation Needs (06/21/2023)   PRAPARE - Administrator, Civil Service (Medical): No    Lack of Transportation (Non-Medical): No  Physical Activity: Sufficiently Active (06/21/2023)   Exercise Vital Sign    Days of Exercise per Week: 4 days    Minutes of Exercise per Session: 40 min  Recent Concern: Physical Activity - Insufficiently Active (05/24/2023)   Exercise Vital Sign    Days of Exercise per Week: 3 days  Minutes of Exercise per Session: 30 min  Stress: No Stress Concern Present (06/21/2023)   [employer] of Occupational Health - Occupational Stress Questionnaire    Feeling of Stress : Not at all  Recent Concern: Stress - Stress Concern Present (05/24/2023)   [employer] of Occupational Health - Occupational Stress Questionnaire    Feeling of Stress : Rather much  Social Connections: Socially Isolated (06/21/2023)   Social Connection and Isolation Panel [NHANES]    Frequency of Communication with Friends and Family: Never    Frequency of Social Gatherings with Friends and Family: Never    Attends Religious Services: Never    Database administrator or Organizations: No    Attends Engineer, structural: Not on file    Marital Status: Married   Catering manager Violence: Not on file    Review of Systems: See HPI, otherwise negative ROS  Physical Exam: BP (!) 157/90   Pulse 65   Temp 97.6 F (36.4 C) (Temporal)   Wt 73.5 kg   LMP 07/06/2023   SpO2 100%   BMI 25.37 kg/m  General:   Alert,  pleasant and cooperative in NAD Head:  Normocephalic and atraumatic. Neck:  Supple; no masses or thyromegaly. Lungs:  Clear throughout to auscultation.    Heart:  Regular rate and rhythm. Abdomen:  Soft, nontender and nondistended. Normal bowel sounds, without guarding, and without rebound.   Neurologic:  Alert and  oriented x4;  grossly normal neurologically.  Impression/Plan: Paige Wade is here for an colonoscopy to be performed for colon cancer screening  Risks, benefits, limitations, and alternatives regarding  colonoscopy have been reviewed with the patient.  Questions have been answered.  All parties agreeable.   Lannette Donath, MD  07/22/2023, 9:50 AM

## 2023-07-22 NOTE — Anesthesia Preprocedure Evaluation (Signed)
Anesthesia Evaluation  Patient identified by MRN, date of birth, ID band Patient awake    Reviewed: Allergy & Precautions, NPO status , Patient's Chart, lab work & pertinent test results  Airway Mallampati: II  TM Distance: >3 FB Neck ROM: full    Dental  (+) Missing, Poor Dentition, Dental Advisory Given   Pulmonary neg pulmonary ROS, Patient abstained from smoking., former smoker   Pulmonary exam normal        Cardiovascular Exercise Tolerance: Good hypertension, Pt. on medications negative cardio ROS Normal cardiovascular exam Rhythm:Regular Rate:Normal     Neuro/Psych negative neurological ROS  negative psych ROS   GI/Hepatic negative GI ROS, Neg liver ROS,,,  Endo/Other  negative endocrine ROS    Renal/GU negative Renal ROS  negative genitourinary   Musculoskeletal   Abdominal   Peds negative pediatric ROS (+)  Hematology negative hematology ROS (+)   Anesthesia Other Findings History reviewed. No pertinent past medical history.  Past Surgical History: No date: CESAREAN SECTION No date: TUBAL LIGATION  BMI    Body Mass Index: 25.37 kg/m      Reproductive/Obstetrics negative OB ROS                             Anesthesia Physical Anesthesia Plan  ASA: 2  Anesthesia Plan: General   Post-op Pain Management:    Induction: Intravenous  PONV Risk Score and Plan: Propofol infusion and TIVA  Airway Management Planned: Natural Airway and Nasal Cannula  Additional Equipment:   Intra-op Plan:   Post-operative Plan:   Informed Consent: I have reviewed the patients History and Physical, chart, labs and discussed the procedure including the risks, benefits and alternatives for the proposed anesthesia with the patient or authorized representative who has indicated his/her understanding and acceptance.     Dental Advisory Given  Plan Discussed with: CRNA  Anesthesia  Plan Comments:        Anesthesia Quick Evaluation

## 2023-07-22 NOTE — Transfer of Care (Signed)
Immediate Anesthesia Transfer of Care Note  Patient: Paige Wade  Procedure(s) Performed: COLONOSCOPY WITH PROPOFOL  Patient Location: PACU and Endoscopy Unit  Anesthesia Type:MAC  Level of Consciousness: drowsy  Airway & Oxygen Therapy: Patient Spontanous Breathing and Patient connected to nasal cannula oxygen  Post-op Assessment: Report given to RN and Post -op Vital signs reviewed and stable  Post vital signs: Reviewed and stable  Last Vitals:  Vitals Value Taken Time  BP 98/65 07/22/23 1012  Temp    Pulse 76 07/22/23 1012  Resp 13 07/22/23 1012  SpO2 98 % 07/22/23 1012    Last Pain:  Vitals:   07/22/23 1012  TempSrc:   PainSc: 0-No pain         Complications: No notable events documented.

## 2023-07-22 NOTE — Anesthesia Postprocedure Evaluation (Signed)
Anesthesia Post Note  Patient: Paige Wade  Procedure(s) Performed: COLONOSCOPY WITH PROPOFOL  Patient location during evaluation: PACU Anesthesia Type: General Level of consciousness: awake and awake and alert Pain management: satisfactory to patient Vital Signs Assessment: post-procedure vital signs reviewed and stable Respiratory status: spontaneous breathing and nonlabored ventilation Cardiovascular status: stable Anesthetic complications: no   No notable events documented.   Last Vitals:  Vitals:   07/22/23 1012 07/22/23 1022  BP: 98/65 (!) 133/90  Pulse: 76 81  Resp: 13 17  Temp:    SpO2: 98% 100%    Last Pain:  Vitals:   07/22/23 1022  TempSrc:   PainSc: 0-No pain                 VAN STAVEREN,Beverlyn Mcginness

## 2023-07-22 NOTE — Op Note (Signed)
[facility] Patient Name: Paige Wade Procedure Date: 07/22/2023 9:39 AM MRN: [id number] Account #: [id number] Date of Birth: 1975-12-20 Admit Type: Outpatient Age: 48 Room: [facility] ENDO ROOM 2 Gender: Female Note Status: Finalized Instrument Name: Nelda Marseille [id number] Procedure:             Colonoscopy Indications:           Screening for colorectal malignant neoplasm, This is                         the patient's first colonoscopy Providers:             Toney Reil MD, MD Referring MD:          Toney Reil MD, MD (Referring MD), Sherlyn Hay (Referring MD) Medicines:             General Anesthesia Complications:         No immediate complications. Estimated blood loss: None. Procedure:             Pre-Anesthesia Assessment:                        - Prior to the procedure, a History and Physical was                         performed, and patient medications and allergies were                         reviewed. The patient is competent. The risks and                         benefits of the procedure and the sedation options and                         risks were discussed with the patient. All questions                         were answered and informed consent was obtained.                         Patient identification and proposed procedure were                         verified by the physician, the nurse, the                         anesthesiologist, the anesthetist and the technician                         in the pre-procedure area in the procedure room in the                         endoscopy suite. Mental Status Examination: alert and                         oriented. Airway Examination: normal oropharyngeal  airway and neck mobility. Respiratory Examination:                         clear to auscultation. CV Examination: normal.                         Prophylactic Antibiotics:  The patient does not require                         prophylactic antibiotics. Prior Anticoagulants: The                         patient has taken no anticoagulant or antiplatelet                         agents. ASA Grade Assessment: II - A patient with mild                         systemic disease. After reviewing the risks and                         benefits, the patient was deemed in satisfactory                         condition to undergo the procedure. The anesthesia                         plan was to use general anesthesia. Immediately prior                         to administration of medications, the patient was                         re-assessed for adequacy to receive sedatives. The                         heart rate, respiratory rate, oxygen saturations,                         blood pressure, adequacy of pulmonary ventilation, and                         response to care were monitored throughout the                         procedure. The physical status of the patient was                         re-assessed after the procedure.                        After obtaining informed consent, the colonoscope was                         passed under direct vision. Throughout the procedure,                         the patient's blood pressure, pulse, and oxygen  saturations were monitored continuously. The                         Colonoscope was introduced through the anus and                         advanced to the the cecum, identified by appendiceal                         orifice and ileocecal valve. The colonoscopy was                         performed without difficulty. The patient tolerated                         the procedure well. The quality of the bowel                         preparation was evaluated using the BBPS [facility] Bowel                         Preparation Scale) with scores of: Right Colon = 3,                         Transverse Colon = 3  and Left Colon = 3 (entire mucosa                         seen well with no residual staining, small fragments                         of stool or opaque liquid). The total BBPS score                         equals 9. The ileocecal valve, appendiceal orifice,                         and rectum were photographed. Findings:      The perianal and digital rectal examinations were normal. Pertinent       negatives include normal sphincter tone and no palpable rectal lesions.      The entire examined colon appeared normal.      The retroflexed view of the distal rectum and anal verge was normal and       showed no anal or rectal abnormalities. Impression:            - The entire examined colon is normal.                        - The distal rectum and anal verge are normal on                         retroflexion view.                        - No specimens collected. Recommendation:        - Discharge patient to home (with escort).                        -  Resume previous diet today.                        - Continue present medications.                        - Repeat colonoscopy in 10 years for screening                         purposes. Procedure Code(s):     --- Professional ---                        Z6109, Colorectal cancer screening; colonoscopy on                         individual not meeting criteria for high risk Diagnosis Code(s):     --- Professional ---                        Z12.11, Encounter for screening for malignant neoplasm                         of colon CPT copyright 2022 American Medical Association. All rights reserved. The codes documented in this report are preliminary and upon coder review may  be revised to meet current compliance requirements. Dr. Libby Maw Toney Reil MD, MD 07/22/2023 10:10:21 AM This report has been signed electronically. Number of Addenda: 0 Note Initiated On: 07/22/2023 9:39 AM Scope Withdrawal Time: 0 hours 7 minutes 3 seconds   Total Procedure Duration: 0 hours 14 minutes 36 seconds  Estimated Blood Loss:  Estimated blood loss: none.      [facility]

## 2023-07-25 ENCOUNTER — Encounter: Payer: Self-pay | Admitting: Gastroenterology

## 2023-07-27 ENCOUNTER — Encounter: Payer: Self-pay | Admitting: Family Medicine

## 2023-07-27 ENCOUNTER — Ambulatory Visit: Payer: Self-pay | Admitting: *Deleted

## 2023-07-27 ENCOUNTER — Ambulatory Visit
Admission: RE | Admit: 2023-07-27 | Discharge: 2023-07-27 | Disposition: A | Discharge status: Home / Self Care | Payer: Medicaid Other | Source: Ambulatory Visit | Attending: Family Medicine | Admitting: Family Medicine

## 2023-07-27 ENCOUNTER — Ambulatory Visit: Payer: Medicaid Other | Admitting: Family Medicine

## 2023-07-27 VITALS — BP 128/84 | HR 68 | Ht 67.0 in | Wt 165.4 lb

## 2023-07-27 DIAGNOSIS — I10 Essential (primary) hypertension: Secondary | ICD-10-CM

## 2023-07-27 DIAGNOSIS — R928 Other abnormal and inconclusive findings on diagnostic imaging of breast: Secondary | ICD-10-CM | POA: Diagnosis present

## 2023-07-27 DIAGNOSIS — R0600 Dyspnea, unspecified: Secondary | ICD-10-CM | POA: Insufficient documentation

## 2023-07-27 DIAGNOSIS — S86899A Other injury of other muscle(s) and tendon(s) at lower leg level, unspecified leg, initial encounter: Secondary | ICD-10-CM | POA: Diagnosis not present

## 2023-07-27 DIAGNOSIS — S86819A Strain of other muscle(s) and tendon(s) at lower leg level, unspecified leg, initial encounter: Secondary | ICD-10-CM | POA: Diagnosis not present

## 2023-07-27 MED ORDER — IBUPROFEN 800 MG PO TABS: 800.0000 mg | ORAL_TABLET | Freq: Two times a day (BID) | ORAL | 0 refills | Status: DC | PRN

## 2023-07-27 NOTE — Telephone Encounter (Signed)
Message from [place] sent at 07/27/2023  8:24 AM EST  Summary: muscle pain   Patient requesting a muscle relaxer due to lower leg and buttocks pain she stated she over did at the gym.          Call History  Contact Date/Time Type Contact Phone/Fax By  07/27/2023 08:22 AM EST Phone (Incoming) [place], [place] (Self) [phone] Harold Barban   Reason for Disposition  [1] MODERATE pain (e.g., interferes with normal activities) AND [2] present > 3 days  Answer Assessment - Initial Assessment Questions 1. ONSET: "When did the muscle aches or body pains start?"      I'm o having lower leg and buttocks pain.   I work at [employer] and I do heavy lifting and fast walking.   I over did it at the gym yesterday   I feel it in the lower part of the legs.   If I sit for 5-10 min. Everything is hurting when I get up to move. 2. LOCATION: "What part of your body is hurting?" (e.g., entire body, arms, legs)      Both legs and buttocks.    3. SEVERITY: "How bad is the pain?" (Scale 1-10; or mild, moderate, severe)   - MILD (1-3): doesn't interfere with normal activities    - MODERATE (4-7): interferes with normal activities or awakens from sleep    - SEVERE (8-10):  excruciating pain, unable to do any normal activities      Moderate 4. CAUSE: "What do you think is causing the pains?"     I over did it at the gym yesterday. 5. FEVER: "Have you been having fever?"     No 6. OTHER SYMPTOMS: "Do you have any other symptoms?" (e.g., chest pain, weakness, rash, cold or flu symptoms, weight loss)     No 7. PREGNANCY: "Is there any chance you are pregnant?" "When was your last menstrual period?"     Not asked 8. TRAVEL: "Have you traveled out of the country in the last month?" (e.g., travel history, exposures)     N/A for situation  Protocols used: Muscle Aches and Body Pain-A-AH

## 2023-07-27 NOTE — Progress Notes (Signed)
Acute Office Visit  Introduced to nurse practitioner role and practice setting.  All questions answered.  Discussed provider/patient relationship and expectations.   Subjective:     Patient ID: Paige Wade, female    DOB: 1975-07-30, 48 y.o.   MRN: [id number]  Chief Complaint  Patient presents with   Muscle Pain    Pt reports she thinks she has been over doing it at the gym and would like to see about receiving a muscle relaxant. Pt reports soreness in both legs and buttocks from over doing it in the gym yesterday. Pt reports trying to use Aleve and Aleve creme with no relief. Reports she was using the treadmill    The 48 year old patient presents with muscle fatigue and soreness in the lower extremities, specifically the thighs, buttocks, and shins, following a strenuous workout on a treadmill on 07/27/23. The patient reports that the workout was more intense than their usual routine, having been challenged by their son to complete five miles in an hour and ten minutes. The patient typically exercises four times a week at a local gym, having started this routine in mid-November.  The patient describes the pain as being located in the lower legs and buttocks, with discomfort particularly noticeable with flexion of foot. The pain is also present when the patient has been sitting for a period of time and then stands up, describing it as if everything is 'locking up'. However, once the patient starts moving around, the discomfort eases.  The patient has been using over-the-counter 200 mg Aleve for pain relief, but reports that it has not been effective. Also states used topical cream.  The patient works a fast-paced job at [employer], which requires a significant amount of walking. The patient expressed concern about the impact of the muscle pain on their ability to perform their job duties.  The patient has a history of pre-diabetes and has been working on weight loss, having lost approximately 10  pounds since starting their exercise routine in November.   Muscle Pain This is a new problem. The current episode started yesterday. The problem occurs 2 to 4 times per day. The problem has been gradually improving since onset. The pain occurs in the context of an injury (gym work out). Pain location: bilateral calves and buttock. The pain is medium. Exacerbated by: foot flexion. Past treatments include OTC NSAID. The treatment provided mild relief. There is no swelling present.    Review of Systems  All other systems reviewed and are negative.     Objective:    BP 128/84 (BP Location: Left Arm, Patient Position: Sitting, Cuff Size: Normal)   Pulse 68   Ht 5\' 7"  (1.702 m)   Wt 165 lb 6.4 oz (75 kg)   LMP 07/06/2023   SpO2 100%   BMI 25.91 kg/m    Physical Exam Constitutional:      Appearance: Normal appearance. She is normal weight.  HENT:     Nose: Nose normal.     Mouth/Throat:     Mouth: Mucous membranes are moist.     Pharynx: Oropharynx is clear.  Eyes:     Extraocular Movements: Extraocular movements intact.     Conjunctiva/sclera: Conjunctivae normal.     Pupils: Pupils are equal, round, and reactive to light.  Cardiovascular:     Rate and Rhythm: Normal rate and regular rhythm.     Pulses: Normal pulses.          Radial pulses are 2+ on  the right side and 2+ on the left side.       Dorsalis pedis pulses are 2+ on the right side and 2+ on the left side.       Posterior tibial pulses are 2+ on the right side and 2+ on the left side.     Heart sounds: Normal heart sounds. No murmur heard.    No friction rub. No gallop.  Pulmonary:     Effort: Pulmonary effort is normal. No respiratory distress.     Breath sounds: Normal breath sounds. No stridor. No wheezing, rhonchi or rales.  Chest:     Chest wall: No tenderness.  Musculoskeletal:     Right lower leg: Tenderness present. No deformity, lacerations or bony tenderness. No edema.     Left lower leg: Tenderness  present. No deformity, lacerations or bony tenderness. No edema.     Right ankle: Normal.     Left ankle: Normal.     Comments: Tenderness with R and L foot dorsi flexion  Skin:    General: Skin is dry.     Capillary Refill: Capillary refill takes less than 2 seconds.  Neurological:     General: No focal deficit present.     Mental Status: She is alert and oriented to person, place, and time. Mental status is at baseline.  Psychiatric:        Mood and Affect: Mood normal.        Behavior: Behavior normal.        Thought Content: Thought content normal.        Judgment: Judgment normal.     No results found for any visits on 07/27/23.      Assessment & Plan:   Problem List Items Addressed This Visit       Cardiovascular and Mediastinum   Hypertension   Chronic, stable today Continue amlodipine 5mg  daily Monitor at home with upper arm cuff automatic Goal SBP<130; DBP<80        Musculoskeletal and Integument   Strain of calf muscle - Primary   -Overexertion during exercise leading to muscle pain in the lower extremities, particularly in the thighs, buttocks and calves. No signs of swelling or other complications. Pulses palpable, no signs of obvious injury. No numbness or tingling. Able to ambulate -Does not appear to be in rhabdomyolysis - will hold off on CMP or urine, as pt is not in generalized full body pain and urine is clear per pt.  -Appears to have develops a muscle strain from overexertion exercise. Discussed this is most likely self limited, and needs more recovery time. -May return back to gym on Monday -Work excuse and return letter given to pt. -Start Ibuprofen 800mg  twice daily for 10 days as needed for muscle pain relief. -Recommend rest, heat, ice, and elevation. -Consider use of a foam roller to improve blood flow and relax muscles. -Advise on hydration and electrolyte balance. -Recommend use of compression socks during exercise. -Advise on gradual  increase in exercise intensity and duration. -Consider lighter footwear, proper fitting during work to reduce strain. -Return to normal exercise routine starting 08/01/2023. -Follow-up as needed for current symptoms if worsen or persist and continue to present for planned April appointment for routine check-up with Dr. Payton Mccallum.      Relevant Medications   ibuprofen (ADVIL) 800 MG tablet     Other   Shin splints   Appears to have developed mild case of shin splints post treadmill incline workout.  Tenderness to  shins with foot flexion. Mild tenderness to touch Rest, elevate, heat/ice and NSAID for next 5 to 7 days Recommend compression sleeves for calves Proper fitting exercise shoes with need for inserts Stretch and use foam roller Increase water intake and electrolytes  Slow increase with exercise intensity, stop work if ever in pain.         Meds ordered this encounter  Medications   ibuprofen (ADVIL) 800 MG tablet    Sig: Take 1 tablet (800 mg total) by mouth 2 (two) times daily as needed.    Dispense:  20 tablet    Refill:  0    Return if symptoms worsen or fail to improve.  I, Sallee Provencal, FNP, have reviewed all documentation for this visit. The documentation on 07/27/23 for the exam, diagnosis, procedures, and orders are all accurate and complete.   Sallee Provencal, FNP

## 2023-07-27 NOTE — Assessment & Plan Note (Addendum)
-  Overexertion during exercise leading to muscle pain in the lower extremities, particularly in the thighs, buttocks and calves. No signs of swelling or other complications. Pulses palpable, no signs of obvious injury. No numbness or tingling. Able to ambulate -Does not appear to be in rhabdomyolysis - will hold off on CMP or urine, as pt is not in generalized full body pain and urine is clear per pt.  -Appears to have develops a muscle strain from overexertion exercise. Discussed this is most likely self limited, and needs more recovery time. -May return back to gym on Monday -Work excuse and return letter given to pt. -Start Ibuprofen 800mg  twice daily for 10 days as needed for muscle pain relief. -Recommend rest, heat, ice, and elevation. -Consider use of a foam roller to improve blood flow and relax muscles. -Advise on hydration and electrolyte balance. -Recommend use of compression socks during exercise. -Advise on gradual increase in exercise intensity and duration. -Consider lighter footwear, proper fitting during work to reduce strain. -Return to normal exercise routine starting 08/01/2023. -Follow-up as needed for current symptoms if worsen or persist and continue to present for planned April appointment for routine check-up with Dr. Payton Mccallum.

## 2023-07-27 NOTE — Assessment & Plan Note (Signed)
Chronic, stable today Continue amlodipine 5mg  daily Monitor at home with upper arm cuff automatic Goal SBP<130; DBP<80

## 2023-07-27 NOTE — Assessment & Plan Note (Signed)
Appears to have developed mild case of shin splints post treadmill incline workout.  Tenderness to shins with foot flexion. Mild tenderness to touch Rest, elevate, heat/ice and NSAID for next 5 to 7 days Recommend compression sleeves for calves Proper fitting exercise shoes with need for inserts Stretch and use foam roller Increase water intake and electrolytes  Slow increase with exercise intensity, stop work if ever in pain.

## 2023-07-27 NOTE — Telephone Encounter (Signed)
  Chief Complaint: Soreness in both legs and buttocks from over doing it in the gym yesterday.   Doing a challenge with her daughter.   I went over home care advice for sore muscles however pt specifically requesting a muscle relaxer Symptoms: Soreness and pain in both legs and buttocks.   Very sore and painful when she moves around. Frequency: Since working out yesterday in the gym Pertinent Negatives: Patient denies injuries just soreness and wanting a muscle relaxer. Disposition: [] ED /[] Urgent Care (no appt availability in office) / [x] Appointment(In office/virtual)/ []  [facility]/ [] Home Care/ [] Refused Recommended Disposition /[] [facility]/ []  Follow-up with PCP Additional Notes: Appt made for today with Charlcie Cradle, FNP for 3:40.

## 2023-07-28 ENCOUNTER — Ambulatory Visit
Admission: RE | Admit: 2023-07-28 | Discharge: 2023-07-28 | Disposition: A | Discharge status: Home / Self Care | Payer: Medicaid Other | Source: Ambulatory Visit | Attending: Pulmonary Disease | Admitting: Pulmonary Disease

## 2023-07-28 DIAGNOSIS — R0600 Dyspnea, unspecified: Secondary | ICD-10-CM | POA: Diagnosis not present

## 2023-07-28 LAB — ECHOCARDIOGRAM COMPLETE
AR max vel: 2.97 cm2
AV Area VTI: 3.49 cm2
AV Area mean vel: 3.06 cm2
AV Mean grad: 2.5 mm[Hg]
AV Peak grad: 4.8 mm[Hg]
Ao pk vel: 1.09 m/s
Area-P 1/2: 5.38 cm2
MV VTI: 3.03 cm2
S' Lateral: 2.8 cm

## 2023-07-28 NOTE — Progress Notes (Signed)
*  PRELIMINARY RESULTS* Echocardiogram 2D Echocardiogram has been performed.  Paige Wade 07/28/2023, 9:41 AM

## 2023-07-29 ENCOUNTER — Encounter: Payer: Self-pay | Admitting: Family Medicine

## 2023-07-29 ENCOUNTER — Telehealth: Payer: Self-pay | Admitting: Family Medicine

## 2023-07-29 DIAGNOSIS — G4726 Circadian rhythm sleep disorder, shift work type: Secondary | ICD-10-CM

## 2023-07-29 MED ORDER — TRAZODONE HCL 50 MG PO TABS: 25.0000 mg | ORAL_TABLET | Freq: Every evening | ORAL | 0 refills | Status: DC | PRN

## 2023-07-29 NOTE — Telephone Encounter (Signed)
[facility] sent fax asking for refills on Trazodone 50 mg. #90

## 2023-08-11 ENCOUNTER — Other Ambulatory Visit: Payer: Self-pay

## 2023-08-11 ENCOUNTER — Encounter: Payer: Self-pay | Admitting: Emergency Medicine

## 2023-08-11 ENCOUNTER — Emergency Department
Admission: EM | Admit: 2023-08-11 | Discharge: 2023-08-11 | Disposition: A | Discharge status: Home / Self Care | Payer: Medicaid Other | Attending: Emergency Medicine | Admitting: Emergency Medicine

## 2023-08-11 DIAGNOSIS — X58XXXA Exposure to other specified factors, initial encounter: Secondary | ICD-10-CM | POA: Diagnosis not present

## 2023-08-11 DIAGNOSIS — S39012A Strain of muscle, fascia and tendon of lower back, initial encounter: Secondary | ICD-10-CM | POA: Insufficient documentation

## 2023-08-11 DIAGNOSIS — M545 Low back pain, unspecified: Secondary | ICD-10-CM | POA: Diagnosis present

## 2023-08-11 LAB — URINALYSIS, ROUTINE W REFLEX MICROSCOPIC
Bilirubin Urine: NEGATIVE
Glucose, UA: NEGATIVE mg/dL
Hgb urine dipstick: NEGATIVE
Ketones, ur: NEGATIVE mg/dL
Leukocytes,Ua: NEGATIVE
Nitrite: NEGATIVE
Protein, ur: NEGATIVE mg/dL
Specific Gravity, Urine: 1.009 (ref 1.005–1.030)
pH: 6 (ref 5.0–8.0)

## 2023-08-11 LAB — POC URINE PREG, ED: Preg Test, Ur: NEGATIVE

## 2023-08-11 MED ORDER — ACETAMINOPHEN 500 MG PO TABS
1000.0000 mg | ORAL_TABLET | Freq: Once | ORAL | Status: AC
  Administered 2023-08-11: 1000 mg via ORAL
  Filled 2023-08-11: qty 2

## 2023-08-11 MED ORDER — IBUPROFEN 600 MG PO TABS: 600.0000 mg | ORAL_TABLET | Freq: Three times a day (TID) | ORAL | 0 refills | Status: AC | PRN

## 2023-08-11 MED ORDER — CYCLOBENZAPRINE HCL 5 MG PO TABS: 5.0000 mg | ORAL_TABLET | Freq: Three times a day (TID) | ORAL | 0 refills | Status: AC | PRN

## 2023-08-11 MED ORDER — KETOROLAC TROMETHAMINE 30 MG/ML IJ SOLN
30.0000 mg | Freq: Once | INTRAMUSCULAR | Status: AC
Start: 2023-08-11 — End: 2023-08-11
  Administered 2023-08-11: 30 mg via INTRAMUSCULAR
  Filled 2023-08-11: qty 1

## 2023-08-11 MED ORDER — LIDOCAINE 5 % EX PTCH
1.0000 | MEDICATED_PATCH | Freq: Once | CUTANEOUS | Status: DC
  Administered 2023-08-11: 1 via TRANSDERMAL
  Filled 2023-08-11: qty 1

## 2023-08-11 MED ORDER — LIDOCAINE 5 % EX PTCH: 1.0000 | MEDICATED_PATCH | Freq: Two times a day (BID) | CUTANEOUS | 0 refills | Status: AC

## 2023-08-11 NOTE — ED Triage Notes (Signed)
 Pt to ED via POV pt ambulatory without distress. Pt states that she has been having right lower back pain since last night.Pt denies any known injury to the area. Pt states that she has used ibuprofen  without relief.

## 2023-08-11 NOTE — ED Provider Notes (Addendum)
 [facility] Provider Note    Event Date/Time   First MD Initiated Contact with Patient 08/11/23 0932     (approximate)   History   Back Pain   HPI  Paige Wade is a 48 y.o. female who comes in with concerns for right low back pain since last night.  She denies any known injury to the area.  She reports taking ibuprofen  without relief in symptoms.  Patient does report for work she does pick up some items.  She also states that sometimes when she walks her dog she gets drained by her dog lease but she is not sure of an exact event that happened yesterday.  She reports no significant pain at rest but pain with movement only.  She reports when she goes to sit up or she twists and turns she has pain.  She denies any radiation of pain into her abdomen or any dysuria, hematuria.  She denies any lower abdominal pain.  She reports that her last menstruation was a month ago and she has not been sexually active since then denies any concerns for pregnancy.  Denies any fevers, IV drug use.  Denies any numbness, tingling, weakness, saddle anesthesia, urinating or defecating on herself.     Physical Exam   Triage Vital Signs: ED Triage Vitals  Encounter Vitals Group     BP 08/11/23 0918 (!) 150/88     Systolic BP Percentile --      Diastolic BP Percentile --      Pulse Rate 08/11/23 0918 88     Resp 08/11/23 0918 16     Temp 08/11/23 0918 98 F (36.7 C)     Temp Source 08/11/23 0918 Oral     SpO2 08/11/23 0918 99 %     Weight 08/11/23 0916 157 lb (71.2 kg)     Height 08/11/23 0916 5' 7 (1.702 m)     Head Circumference --      Peak Flow --      Pain Score 08/11/23 0916 9     Pain Loc --      Pain Education --      Exclude from Growth Chart --     Most recent vital signs: Vitals:   08/11/23 0918  BP: (!) 150/88  Pulse: 88  Resp: 16  Temp: 98 F (36.7 C)  SpO2: 99%     General: Awake, no distress.  CV:  Good peripheral perfusion.  Resp:  Normal  effort.  Abd:  No distention.  Other:  No CTL spine tenderness.  She is got right flank pain.  No significant pain with palpation but when she goes to sit up she screams out.  When she twist she reports pain or bends over.  She got equal strength in her legs.  Sensation intact in bilateral legs.  She is able to ambulate without difficulties. Able to flex and extend at the ankles.  ED Results / Procedures / Treatments   Labs (all labs ordered are listed, but only abnormal results are displayed) Labs Reviewed  URINALYSIS, ROUTINE W REFLEX MICROSCOPIC  POC URINE PREG, ED     PROCEDURES:  Critical Care performed: No  Procedures   MEDICATIONS ORDERED IN ED: Medications  lidocaine  (LIDODERM ) 5 % 1 patch (1 patch Transdermal Patch Applied 08/11/23 1009)  acetaminophen  (TYLENOL ) tablet 1,000 mg (1,000 mg Oral Given 08/11/23 1008)  ketorolac  (TORADOL ) 30 MG/ML injection 30 mg (30 mg Intramuscular Given 08/11/23 1008)  IMPRESSION / MDM / ASSESSMENT AND PLAN / ED COURSE  I reviewed the triage vital signs and the nursing notes.   Patient's presentation is most consistent with acute presentation with potential threat to life or bodily function.   I reviewed her blood work from October 2024 where patient has normal creatinine  I reviewed patient's office visit from 1/22 where patient was seen for a strained calf muscle  Patient comes in with right low back pain that seems musculoskeletal in nature given no significant pain with sitting but worsens with certain movements.  This patient slightly hypertensive but I suspect this is related to pain.  Her abdomen is soft and nontender I have low suspicion for acute abdominal processes.  She did have a CT scan back in 2023 without any evidence of aneurysm so this seems less likely.  She denies any urinary symptoms that this does not seem consistent with kidney stone.  At this time I do not feel like labs or CT imaging are warranted.  Will treat  conservatively with lidocaine , Tylenol , Toradol , flexeril    UA no infection or RBC to suggest kidney stone Urine preg negative.   We discussed risk of Flexeril  and do not drive or work on it but usually use it at nighttime or during the day if she does not have to be up and about and she expressed understanding felt comfortable with that plan.     FINAL CLINICAL IMPRESSION(S) / ED DIAGNOSES   Final diagnoses:  Strain of lumbar region, initial encounter     Rx / DC Orders   ED Discharge Orders          Ordered    ibuprofen  (ADVIL ) 600 MG tablet  Every 8 hours PRN        08/11/23 1042    lidocaine  (LIDODERM ) 5 %  Every 12 hours        08/11/23 1042    cyclobenzaprine  (FLEXERIL ) 5 MG tablet  3 times daily PRN        08/11/23 1042             Note:  This document was prepared using Dragon voice recognition software and may include unintentional dictation errors.     Ernest Ronal BRAVO, MD 08/11/23 [phone]

## 2023-08-11 NOTE — Discharge Instructions (Signed)
 Use medications with Tylenol  1 g every 8 hours.  Do not drive or work with all on the Flexeril  as it can make you sedated. Return to the ER if you develop inability to fully urinate, numbness, inability to ambulate or any other concerns.

## 2023-10-06 ENCOUNTER — Ambulatory Visit: Payer: Medicaid Other | Admitting: Pulmonary Disease

## 2023-10-10 ENCOUNTER — Ambulatory Visit: Admitting: Pulmonary Disease

## 2023-11-02 ENCOUNTER — Encounter: Payer: Self-pay | Admitting: Family Medicine

## 2023-11-02 ENCOUNTER — Ambulatory Visit (INDEPENDENT_AMBULATORY_CARE_PROVIDER_SITE_OTHER): Payer: Self-pay | Admitting: Family Medicine

## 2023-11-02 ENCOUNTER — Other Ambulatory Visit
Admission: RE | Admit: 2023-11-02 | Discharge: 2023-11-02 | Disposition: A | Discharge status: Home / Self Care | Payer: Self-pay | Source: Ambulatory Visit | Attending: Medical Genetics | Admitting: Medical Genetics

## 2023-11-02 VITALS — BP 136/79 | HR 72 | Ht 67.0 in | Wt 160.8 lb

## 2023-11-02 DIAGNOSIS — R195 Other fecal abnormalities: Secondary | ICD-10-CM

## 2023-11-02 DIAGNOSIS — I10 Essential (primary) hypertension: Secondary | ICD-10-CM

## 2023-11-02 DIAGNOSIS — E559 Vitamin D deficiency, unspecified: Secondary | ICD-10-CM

## 2023-11-02 DIAGNOSIS — Z13220 Encounter for screening for lipoid disorders: Secondary | ICD-10-CM

## 2023-11-02 DIAGNOSIS — R7303 Prediabetes: Secondary | ICD-10-CM

## 2023-11-02 DIAGNOSIS — M792 Neuralgia and neuritis, unspecified: Secondary | ICD-10-CM

## 2023-11-02 DIAGNOSIS — Z006 Encounter for examination for normal comparison and control in clinical research program: Secondary | ICD-10-CM | POA: Insufficient documentation

## 2023-11-02 LAB — POCT GLYCOSYLATED HEMOGLOBIN (HGB A1C): Hemoglobin A1C: 5.3 % (ref 4.0–5.6)

## 2023-11-02 NOTE — Patient Instructions (Signed)
 Managing Your Hypertension Hypertension, also called high blood pressure, is when the force of the blood pressing against the walls of the arteries is too strong. Arteries are blood vessels that carry blood from your heart throughout your body. Hypertension forces the heart to work harder to pump blood and may cause the arteries to become narrow or stiff. Understanding blood pressure readings A blood pressure reading includes a higher number over a lower number: The first, or top, number is called the systolic pressure. It is a measure of the pressure in your arteries as your heart beats. The second, or bottom number, is called the diastolic pressure. It is a measure of the pressure in your arteries as the heart relaxes. For most people, a normal blood pressure is below 120/80. Your personal target blood pressure may vary depending on your medical conditions, your age, and other factors. Blood pressure is classified into four stages. Based on your blood pressure reading, your health care provider may use the following stages to determine what type of treatment you need, if any. Systolic pressure and diastolic pressure are measured in a unit called millimeters of mercury (mmHg). Normal Systolic pressure: below 120. Diastolic pressure: below 80. Elevated Systolic pressure: 120-129. Diastolic pressure: below 80. Hypertension stage 1 Systolic pressure: 130-139. Diastolic pressure: 80-89. Hypertension stage 2 Systolic pressure: 140 or above. Diastolic pressure: 90 or above. How can this condition affect me? Managing your hypertension is very important. Over time, hypertension can damage the arteries and decrease blood flow to parts of the body, including the brain, heart, and kidneys. Having untreated or uncontrolled hypertension can lead to: A heart attack. A stroke. A weakened blood vessel (aneurysm). Heart failure. Kidney damage. Eye damage. Memory and concentration problems. Vascular  dementia. What actions can I take to manage this condition? Hypertension can be managed by making lifestyle changes and possibly by taking medicines. Your health care provider will help you make a plan to bring your blood pressure within a normal range. You may be referred for counseling on a healthy diet and physical activity. Nutrition  Eat a diet that is high in fiber and potassium, and low in salt (sodium), added sugar, and fat. An example eating plan is called the DASH diet. DASH stands for Dietary Approaches to Stop Hypertension. To eat this way: Eat plenty of fresh fruits and vegetables. Try to fill one-half of your plate at each meal with fruits and vegetables. Eat whole grains, such as whole-wheat pasta, brown rice, or whole-grain bread. Fill about one-fourth of your plate with whole grains. Eat low-fat dairy products. Avoid fatty cuts of meat, processed or cured meats, and poultry with skin. Fill about one-fourth of your plate with lean proteins such as fish, chicken without skin, beans, eggs, and tofu. Avoid pre-made and processed foods. These tend to be higher in sodium, added sugar, and fat. Reduce your daily sodium intake. Many people with hypertension should eat less than 1,500 mg of sodium a day. Lifestyle  Work with your health care provider to maintain a healthy body weight or to lose weight. Ask what an ideal weight is for you. Get at least 30 minutes of exercise that causes your heart to beat faster (aerobic exercise) most days of the week. Activities may include walking, swimming, or biking. Include exercise to strengthen your muscles (resistance exercise), such as weight lifting, as part of your weekly exercise routine. Try to do these types of exercises for 30 minutes at least 3 days a week. Do  not use any products that contain nicotine or tobacco. These products include cigarettes, chewing tobacco, and vaping devices, such as e-cigarettes. If you need help quitting, ask your  health care provider. Control any long-term (chronic) conditions you have, such as high cholesterol or diabetes. Identify your sources of stress and find ways to manage stress. This may include meditation, deep breathing, or making time for fun activities. Alcohol use Do not drink alcohol if: Your health care provider tells you not to drink. You are pregnant, may be pregnant, or are planning to become pregnant. If you drink alcohol: Limit how much you have to: 0-1 drink a day for women. 0-2 drinks a day for men. Know how much alcohol is in your drink. In the [place] one drink equals one 12 oz bottle of beer (355 mL), one 5 oz glass of wine (148 mL), or one 1 oz glass of hard liquor (44 mL). Medicines Your health care provider may prescribe medicine if lifestyle changes are not enough to get your blood pressure under control and if: Your systolic blood pressure is 130 or higher. Your diastolic blood pressure is 80 or higher. Take medicines only as told by your health care provider. Follow the directions carefully. Blood pressure medicines must be taken as told by your health care provider. The medicine does not work as well when you skip doses. Skipping doses also puts you at risk for problems. Monitoring Before you monitor your blood pressure: Do not smoke, drink caffeinated beverages, or exercise within 30 minutes before taking a measurement. Use the bathroom and empty your bladder (urinate). Sit quietly for at least 5 minutes before taking measurements. Monitor your blood pressure at home as told by your health care provider. To do this: Sit with your back straight and supported. Place your feet flat on the floor. Do not cross your legs. Support your arm on a flat surface, such as a table. Make sure your upper arm is at heart level. Each time you measure, take two or three readings one minute apart and record the results. You may also need to have your blood pressure checked regularly by  your health care provider. General information Talk with your health care provider about your diet, exercise habits, and other lifestyle factors that may be contributing to hypertension. Review all the medicines you take with your health care provider because there may be side effects or interactions. Keep all follow-up visits. Your health care provider can help you create and adjust your plan for managing your high blood pressure. Where to find more information National Heart, Lung, and Blood Institute: PopSteam.is American Heart Association: www.heart.org Contact a health care provider if: You think you are having a reaction to medicines you have taken. You have repeated (recurrent) headaches. You feel dizzy. You have swelling in your ankles. You have trouble with your vision. Get help right away if: You develop a severe headache or confusion. You have unusual weakness or numbness, or you feel faint. You have severe pain in your chest or abdomen. You vomit repeatedly. You have trouble breathing. These symptoms may be an emergency. Get help right away. Call 911. Do not wait to see if the symptoms will go away. Do not drive yourself to the hospital. Summary Hypertension is when the force of blood pumping through your arteries is too strong. If this condition is not controlled, it may put you at risk for serious complications. Your personal target blood pressure may vary depending on your medical conditions,  your age, and other factors. For most people, a normal blood pressure is less than 120/80. Hypertension is managed by lifestyle changes, medicines, or both. Lifestyle changes to help manage hypertension include losing weight, eating a healthy, low-sodium diet, exercising more, stopping smoking, and limiting alcohol. This information is not intended to replace advice given to you by your health care provider. Make sure you discuss any questions you have with your health care  provider. Document Revised: 03/05/2021 Document Reviewed: 03/05/2021 Elsevier Patient Education  2024 [employer].

## 2023-11-02 NOTE — Progress Notes (Signed)
 Established patient visit   Patient: Paige Wade   DOB: 1975/08/31   48 y.o. Female  MRN: [id number] Visit Date: 11/02/2023  Today's healthcare provider: Carlean Charter, DO   Chief Complaint  Patient presents with   Medical Management of Chronic Issues   Hypertension   Prediabetes    Patient reports no symptoms and does not monitor at this time   Bowel movements   Arm Pain    Lower left arm pain X 3 days once getting off work. Describes as a burning sensation. Has used ointment and wears brace   Subjective    HPI Paige Wade is a 48 year old female with hypertension who presents for a regular follow-up visit.  She notes her blood pressure was slightly elevated during the visit, which she attributes to working a third shift, lack of sleep, and not having eaten. She has not been checking her blood pressure at home but plans to resume doing so. She is currently taking amlodipine  for hypertension and missed one dose recently, which she took later in the day. She felt 'off' and a little tired after missing the dose.  She has a history of vitamin D  deficiency and has been taking 5000 units daily. She requested a recheck of her vitamin D  levels. She is aware that her A1c was previously elevated. She has been trying to eat less sugar and has been more active, which she believes has helped.  She experiences occasional shortness of breath, particularly when rushing or going up stairs, which she attributes to her work. She later clarified that she was rushing up 25-30 stairs She was previously prescribed Advair , which she takes infrequently, and was told by a pulmonologist that she does not need it. She has a history of smoking but has quit.  She underwent a colonoscopy, which was normal, but she reports changes in her bowel movements, describing them as 'soft mush' rather than firm. She has noticed that increasing her fiber intake helps firm up her stools. She consumes [place] yogurt  daily with added ingredients like strawberries, blueberries, chia seeds, olive oil and cinnamon.  - Bristol stool scale rating 5-6  She experiences arm pain, which she describes as a burning pain that extends from the wrist upward to midarm on the dorsal aspect, which she attributes to her work activities. She uses a brace while working to prevent further injury. She has a history of a previous arm injury from lifting heavy bags at work.  She has been taking vitamin D  supplements and Aleve  for soreness. She reports that her hair was shedding at one point, which she attributes to stress and possibly a change in water after moving.      Medications: Outpatient Medications Prior to Visit  Medication Sig   amLODipine  (NORVASC ) 5 MG tablet Take 1 tablet (5 mg total) by mouth daily.   Cholecalciferol (VITAMIN D3) 125 MCG (5000 UT) CAPS Take 1 capsule (5,000 Units total) by mouth daily.   fluticasone -salmeterol (ADVAIR  DISKUS) 100-50 MCG/ACT AEPB Inhale 1 puff into the lungs 2 (two) times daily.   magnesium gluconate (MAGONATE) 500 MG tablet Take 500 mg by mouth 2 (two) times daily.   nortriptyline  (PAMELOR ) 10 MG capsule Take 1 capsule (10 mg total) by mouth at bedtime.   traZODone  (DESYREL ) 50 MG tablet Take 0.5-1 tablets (25-50 mg total) by mouth at bedtime as needed for sleep.   No facility-administered medications prior to visit.  Objective    BP 136/79 (BP Location: Right Arm, Patient Position: Sitting, Cuff Size: Normal)   Pulse 72   Ht 5\' 7"  (1.702 m)   Wt 160 lb 12.8 oz (72.9 kg)   LMP 10/19/2023   SpO2 100%   BMI 25.18 kg/m     Physical Exam Vitals and nursing note reviewed.  Constitutional:      General: She is not in acute distress.    Appearance: Normal appearance.  HENT:     Head: Normocephalic and atraumatic.  Eyes:     General: No scleral icterus.    Conjunctiva/sclera: Conjunctivae normal.  Cardiovascular:     Rate and Rhythm: Normal rate.   Pulmonary:     Effort: Pulmonary effort is normal.  Neurological:     Mental Status: She is alert and oriented to person, place, and time. Mental status is at baseline.  Psychiatric:        Mood and Affect: Mood normal.        Behavior: Behavior normal.      Results for orders placed or performed in visit on 11/02/23  POCT HgB A1C  Result Value Ref Range   Hemoglobin A1C 5.3 4.0 - 5.6 %   HbA1c POC (<> result, manual entry)     HbA1c, POC (prediabetic range)     HbA1c, POC (controlled diabetic range)      Assessment & Plan    Pre-diabetes -     POCT glycosylated hemoglobin (Hb A1C)  Primary hypertension -     Lipid panel  Neuropathic pain of left forearm  Vitamin D  deficiency -     VITAMIN D  25 Hydroxy (Vit-D Deficiency, Fractures)  Loose stools  Screening for lipid disorders -     Lipid panel    Prediabetes Recheck of A1c today was 5.4, down from 5.8.  Improvement likely related to dietary changes and increased activity, particularly with recent job start, in which patient states she is constantly active, including some lifting of heavy boxes, for several hours straight. - continue lifestyle modifications  Hypertension Blood pressure slightly elevated, likely due to lack of sleep and missed medication dose. Discussed potential lifelong medication need but noted lifestyle changes could reduce medication need. - Check blood pressure at home regularly. - Bring blood pressure cuff and log to next visit.  Neuropathic pain of left forearm Intermittent arm pain likely related to work activities. Discussed that consistent use of arm brace may contribute to weakening of supportive muscles and be contributing to her current pain. Discussed potential for physical therapy if symptoms persist.  - Consider referral to physical therapy if symptoms persist.  Loose stools Soft stools possibly related to dietary habits. Recommended dietary modifications to increase fiber intake  and remove olive oil and cinnamon from yogurt. - Increase vegetable and grain intake. - Remove olive oil and cinnamon from yogurt.  Vitamin D  deficiency Currently taking 5000 units daily. Vitamin D  levels to be rechecked. - Order vitamin D  level.  Routine health maintenance Routine follow-up visit. Reviewed health status, BMI slightly above range, A1c excellent. Discussed maintaining healthy weight and diet to prevent diabetes. - Order cholesterol panel. - Plan to see back in six months for annual visit.  Return in about 6 months (around 05/03/2024) for CPE.      I discussed the assessment and treatment plan with the patient  The patient was provided an opportunity to ask questions and all were answered. The patient agreed with the plan and demonstrated an  understanding of the instructions.   The patient was advised to call back or seek an in-person evaluation if the symptoms worsen or if the condition fails to improve as anticipated.    Carlean Charter, DO  [facility] [phone] (phone) [phone] (fax)  [facility]

## 2023-11-03 LAB — LIPID PANEL
Chol/HDL Ratio: 3.4 ratio (ref 0.0–4.4)
Cholesterol, Total: 183 mg/dL (ref 100–199)
HDL: 54 mg/dL (ref >39)
LDL Chol Calc (NIH): 115 mg/dL — ABNORMAL HIGH (ref 0–99)
Triglycerides: 76 mg/dL (ref 0–149)
VLDL Cholesterol Cal: 14 mg/dL (ref 5–40)

## 2023-11-03 LAB — VITAMIN D 25 HYDROXY (VIT D DEFICIENCY, FRACTURES): Vit D, 25-Hydroxy: 49.7 ng/mL (ref 30.0–100.0)

## 2023-11-09 ENCOUNTER — Encounter: Payer: Self-pay | Admitting: Family Medicine

## 2023-11-11 LAB — GENECONNECT MOLECULAR SCREEN: Genetic Analysis Overall Interpretation: NEGATIVE

## 2023-11-21 ENCOUNTER — Ambulatory Visit: Admitting: Pulmonary Disease

## 2023-11-22 ENCOUNTER — Ambulatory Visit: Admitting: Pulmonary Disease

## 2023-11-22 ENCOUNTER — Encounter: Payer: Self-pay | Admitting: Pulmonary Disease

## 2023-11-22 VITALS — Ht 67.0 in | Wt 159.0 lb

## 2023-11-22 DIAGNOSIS — R0600 Dyspnea, unspecified: Secondary | ICD-10-CM

## 2023-11-30 ENCOUNTER — Other Ambulatory Visit: Payer: Self-pay

## 2023-11-30 ENCOUNTER — Ambulatory Visit (INDEPENDENT_AMBULATORY_CARE_PROVIDER_SITE_OTHER): Admitting: Pulmonary Disease

## 2023-11-30 ENCOUNTER — Encounter: Payer: Self-pay | Admitting: Pulmonary Disease

## 2023-11-30 DIAGNOSIS — R0600 Dyspnea, unspecified: Secondary | ICD-10-CM

## 2023-11-30 LAB — PULMONARY FUNCTION TEST
DL/VA % pred: 106 %
DL/VA: 4.49 ml/min/mmHg/L
DLCO unc % pred: 88 %
DLCO unc: 20.78 ml/min/mmHg
FEF 25-75 Post: 1.57 L/s
FEF 25-75 Pre: 2.44 L/s
FEF2575-%Change-Post: -35 %
FEF2575-%Pred-Post: 51 %
FEF2575-%Pred-Pre: 79 %
FEV1-%Change-Post: -18 %
FEV1-%Pred-Post: 66 %
FEV1-%Pred-Pre: 82 %
FEV1-Post: 2.11 L
FEV1-Pre: 2.6 L
FEV1FVC-%Change-Post: -16 %
FEV1FVC-%Pred-Pre: 95 %
FEV6-%Change-Post: -1 %
FEV6-%Pred-Post: 84 %
FEV6-%Pred-Pre: 85 %
FEV6-Post: 3.26 L
FEV6-Pre: 3.31 L
FEV6FVC-%Change-Post: 1 %
FEV6FVC-%Pred-Post: 102 %
FEV6FVC-%Pred-Pre: 101 %
FVC-%Change-Post: -2 %
FVC-%Pred-Post: 82 %
FVC-%Pred-Pre: 84 %
FVC-Post: 3.27 L
FVC-Pre: 3.35 L
Post FEV1/FVC ratio: 64 %
Post FEV6/FVC ratio: 100 %
Pre FEV1/FVC ratio: 77 %
Pre FEV6/FVC Ratio: 99 %
RV % pred: 163 %
RV: 3.08 L
TLC % pred: 113 %
TLC: 6.27 L

## 2023-11-30 LAB — NITRIC OXIDE: Nitric Oxide: 11

## 2023-11-30 MED ORDER — BUDESONIDE-FORMOTEROL FUMARATE 80-4.5 MCG/ACT IN AERO: 2.0000 | INHALATION_SPRAY | Freq: Two times a day (BID) | RESPIRATORY_TRACT | 12 refills | Status: DC

## 2023-11-30 NOTE — Progress Notes (Unsigned)
 Synopsis: Referred in by Annitta Kindler, MD   Subjective:   PATIENT ID: Paige Wade GENDER: female DOB: [birth year], MRN: [id number]  Chief Complaint  Patient presents with   Follow-up    SOB. No wheezing or cough. Had PFT today.   HPI Paige Wade is a 48 year old female patient with a past medical history of hypertension, presumed COPD on Trelegy presenting to the pulmonary clinic today to establish care.  She reports shortness of breath on exertion mostly going up the stairs. This started 4 months ago.She denies any chest tightness, wheezing, chest pain, cough or sputum production. Her mom passed away at 54 from a pulmonary heart disease? And this have her anxious.   She denies having the diagnosis of asthma. She denies any hypersensitivity to cold air, strong scents or humidity  Family history - Mom passed away at 31 of a cardiopulmonary diseaes ?   Social history - Ex smoker, quit in June 2024, smoked 1ppd for 25 years. She denies alcohol use, denies illicit drug use including marijuana. She works at [employer]. Has 1 cat and 1 dog. She has 3 kids at home who are healthy.    ROS All systems were reviewed and are negative except for the above. Objective:   Vitals:   11/30/23 1601  BP: 110/62  Pulse: 80  Temp: (!) 97.3 F (36.3 C)  SpO2: 96%  Weight: 162 lb 9.6 oz (73.8 kg)  Height: 5\' 7"  (1.702 m)   96% on RA BMI Readings from Last 3 Encounters:  11/30/23 25.47 kg/m  11/30/23 25.47 kg/m  11/22/23 24.90 kg/m   Wt Readings from Last 3 Encounters:  11/30/23 162 lb 9.6 oz (73.8 kg)  11/30/23 162 lb 9.6 oz (73.8 kg)  11/22/23 159 lb (72.1 kg)    Physical Exam GEN: NAD, Healthy Appearing HEENT: Supple Neck, Reactive Pupils, EOMI  CVS: Normal S1, Normal S2, RRR, No murmurs or ES appreciated  Lungs: Clear bilateral air entry.  Abdomen: Soft, non tender, non distended, + BS  Extremities: Warm and well perfused, No edema  Skin: No suspicious  lesions appreciated  Psych: Normal Affect  Labs and imaging were reviewed.  Ancillary Information   CBC    Component Value Date/Time   WBC 6.3 05/04/2023 1134   WBC 12.5 (H) 02/20/2022 2147   RBC 4.10 05/04/2023 1134   RBC 4.40 02/20/2022 2147   HGB 12.8 05/04/2023 1134   HCT 39.0 05/04/2023 1134   PLT 405 05/04/2023 1134   MCV 95 05/04/2023 1134   MCV 101 (H) 12/17/2011 0243   MCH 31.2 05/04/2023 1134   MCH 31.8 02/20/2022 2147   MCHC 32.8 05/04/2023 1134   MCHC 33.2 02/20/2022 2147   RDW 11.9 05/04/2023 1134   RDW 12.4 12/17/2011 0243   LYMPHSABS 3.4 (H) 05/04/2023 1134   MONOABS 0.4 07/12/2019 1555   EOSABS 0.3 05/04/2023 1134   BASOSABS 0.0 05/04/2023 1134       Latest Ref Rng & Units 11/30/2023    2:42 PM  PFT Results  FVC-Pre L 3.35  P  FVC-Predicted Pre % 84  P  FVC-Post L 3.27  P  FVC-Predicted Post % 82  P  Pre FEV1/FVC % % 77  P  Post FEV1/FCV % % 64  P  FEV1-Pre L 2.60  P  FEV1-Predicted Pre % 82  P  FEV1-Post L 2.11  P  DLCO uncorrected ml/min/mmHg 20.78  P  DLCO UNC% % 88  P  DLVA Predicted % 106  P  TLC L 6.27  P  TLC % Predicted % 113  P  RV % Predicted % 163  P    P Preliminary result     Assessment & Plan:  Paige Wade is a 48 year old female patient with a past medical history of hypertension, presumed COPD on Trelegy presenting to the pulmonary clinic today to establish care.  #Dyspnea  Describe as mild, only happens when going up the stairs. She recently joined [employer] and is walking only on a daily basis. I doubt she has severe COPD and not sure when she was placed on Trelegy. One could think of PH in 48 year old female with dyspnea.   []  PFTs  []  Echocardiogram  No follow-ups on file.  I spent 60 minutes caring for this patient today, including preparing to see the patient, obtaining a medical history , reviewing a separately obtained history, performing a medically appropriate examination and/or evaluation, counseling and  educating the patient/family/caregiver, ordering medications, tests, or procedures, documenting clinical information in the electronic health record, and independently interpreting results (not separately reported/billed) and communicating results to the patient/family/caregiver  Annitta Kindler, MD [facility] 11/30/2023 4:15 PM

## 2023-11-30 NOTE — Progress Notes (Signed)
 Full PFT completed today ? ?

## 2023-11-30 NOTE — Patient Instructions (Signed)
 Full PFT completed today ? ?

## 2023-12-19 ENCOUNTER — Ambulatory Visit
Admission: RE | Admit: 2023-12-19 | Discharge: 2023-12-19 | Disposition: A | Discharge status: Home / Self Care | Attending: Pulmonary Disease | Admitting: Pulmonary Disease

## 2023-12-19 ENCOUNTER — Ambulatory Visit
Admission: RE | Admit: 2023-12-19 | Discharge: 2023-12-19 | Disposition: A | Discharge status: Home / Self Care | Source: Ambulatory Visit | Attending: Pulmonary Disease | Admitting: Pulmonary Disease

## 2023-12-19 DIAGNOSIS — R0600 Dyspnea, unspecified: Secondary | ICD-10-CM

## 2024-01-11 ENCOUNTER — Other Ambulatory Visit (HOSPITAL_COMMUNITY): Payer: Self-pay

## 2024-02-27 NOTE — Progress Notes (Signed)
 Followed up on 05/28

## 2024-02-29 ENCOUNTER — Ambulatory Visit: Admitting: Family Medicine

## 2024-03-06 HISTORY — PX: TOOTH EXTRACTION: SHX859

## 2024-03-09 ENCOUNTER — Ambulatory Visit: Payer: Self-pay | Admitting: *Deleted

## 2024-03-09 NOTE — Telephone Encounter (Signed)
 Called and spoke to the pt, she has been advised to reach out to her dentist who did the procedure to request the medication

## 2024-03-09 NOTE — Telephone Encounter (Signed)
 FYI Only or Action Required?: FYI only for provider.  Patient was last seen in primary care on 11/02/2023 by Donzella Lauraine SAILOR, DO.  Called Nurse Triage reporting Dental Pain.  Symptoms began today.  Interventions attempted: Prescription medications: ibuprofen  .  Symptoms are: gradually improving.  Triage Disposition: Call Dentist When Office is Open  Patient/caregiver understands and will follow disposition?: Patient has contacted Dentist- no response back- advised options- UC/VV or UC for pain- she will see if she gets call back of try OTC dosing.  Reason for Disposition  [1] Tooth extraction > 3 days ago AND [2] pain not improving  Answer Assessment - Initial Assessment Questions 1. SYMPTOM: What's the main symptom you're concerned about? (e.g., bleeding, pain, fever)     Pain with dentures- patient had all teeth extracted- 03/06/24 2. ONSET: When did the pain  start?     03/06/24 3. DATE of TOOTH EXTRACTION: When was the surgery performed?      Tuesday 4. BLEEDING: Is there any bleeding? If Yes, ask: How much?      subsided 5. PAIN: Is there any pain? If Yes, ask: How bad is it?  (Scale 1-10; or mild, moderate, severe)     Ibuprofen  800mg  was given- but patient is out of medication - 3 times/day 6. FEVER: Do you have a fever? If Yes, ask: What is your temperature, how was it measured, and when did it start?     no 7. OTHER SYMPTOMS: Do you have any other symptoms? (e.g., face swelling)     No- using ice packs and antibiotic. Patient just ran out of medication for her pain- she has not had response from her dentist and is reaching out to PCP for help. Patient advised of her options at this hour- UCVV/UC for her pain. Patient may also try decreased OTC ibuprofen  dosing.  Protocols used: Tooth Extraction-A-AH   Copied from CRM [id number]. Topic: Clinical - Red Word Triage >> Mar 09, 2024  3:47 PM Avram MATSU wrote: Red Word that prompted transfer to Nurse Triage:  patient had a recent procedure and is in a lot of pain

## 2024-03-14 ENCOUNTER — Ambulatory Visit

## 2024-03-16 ENCOUNTER — Ambulatory Visit

## 2024-03-26 ENCOUNTER — Ambulatory Visit: Admitting: Family Medicine

## 2024-03-27 ENCOUNTER — Encounter (INDEPENDENT_AMBULATORY_CARE_PROVIDER_SITE_OTHER): Payer: Self-pay

## 2024-04-13 ENCOUNTER — Ambulatory Visit: Admitting: Pulmonary Disease

## 2024-04-19 ENCOUNTER — Ambulatory Visit (INDEPENDENT_AMBULATORY_CARE_PROVIDER_SITE_OTHER): Admitting: Family Medicine

## 2024-04-19 VITALS — BP 112/71 | HR 69 | Temp 98.8°F | Ht 67.0 in | Wt 152.8 lb

## 2024-04-19 DIAGNOSIS — G8929 Other chronic pain: Secondary | ICD-10-CM

## 2024-04-19 DIAGNOSIS — F17201 Nicotine dependence, unspecified, in remission: Secondary | ICD-10-CM

## 2024-04-19 DIAGNOSIS — G4726 Circadian rhythm sleep disorder, shift work type: Secondary | ICD-10-CM

## 2024-04-19 DIAGNOSIS — E559 Vitamin D deficiency, unspecified: Secondary | ICD-10-CM

## 2024-04-19 DIAGNOSIS — M25551 Pain in right hip: Secondary | ICD-10-CM

## 2024-04-19 DIAGNOSIS — E7800 Pure hypercholesterolemia, unspecified: Secondary | ICD-10-CM

## 2024-04-19 DIAGNOSIS — I10 Essential (primary) hypertension: Secondary | ICD-10-CM | POA: Diagnosis not present

## 2024-04-19 DIAGNOSIS — M545 Low back pain, unspecified: Secondary | ICD-10-CM

## 2024-04-19 DIAGNOSIS — R7309 Other abnormal glucose: Secondary | ICD-10-CM

## 2024-04-19 MED ORDER — IBUPROFEN 600 MG PO TABS: 600.0000 mg | ORAL_TABLET | Freq: Every evening | ORAL | 0 refills | Status: DC | PRN

## 2024-04-19 MED ORDER — CYCLOBENZAPRINE HCL 5 MG PO TABS: 5.0000 mg | ORAL_TABLET | Freq: Every evening | ORAL | 1 refills | Status: AC | PRN

## 2024-04-19 MED ORDER — AMLODIPINE BESYLATE 5 MG PO TABS: 5.0000 mg | ORAL_TABLET | Freq: Every day | ORAL | 1 refills | Status: AC

## 2024-04-19 MED ORDER — TRAZODONE HCL 50 MG PO TABS: 25.0000 mg | ORAL_TABLET | Freq: Every evening | ORAL | 0 refills | Status: DC | PRN

## 2024-04-19 NOTE — Progress Notes (Signed)
 Established patient visit   Patient: Paige Wade   DOB: 1976-04-20   49 y.o. Female  MRN: [id number] Visit Date: 04/19/2024  Today's healthcare provider: LAURAINE LOISE BUOY, DO   Chief Complaint  Patient presents with   Medication Management    Patient states she is here today for a follow up to see if she should still be taking Vitamin D  and also a check up on her blood pressure.  BP has been running a little on the low side unless she goes out to places and she seems to get anxious.   Subjective    HPI Last annual exam: 05/04/2023   Paige Wade is a 48 year old female who presents with a follow-up visit to recheck vitamin D  levels and discuss back and hip pain.  She has not been taking any vitamin D  supplements for approximately a month since her prescription ran out. She reports concern about whether her diet provides sufficient vitamin D  and is concerned about her levels.  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 The pain occurs when she bends over and intensifies when she gets halfway up, especially in her hip. The pain is exacerbated by lifting boxes. She occasionally uses trazodone  for sleep, which she finds effective, and requests a muscle relaxer for her back pain. She also inquires about prescription ibuprofen , noting that 800 mg works better for her than over-the-counter options, although she typically uses it three times a week as needed.  No smoking and no intention of resuming. 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 She notes hip pain when bending over.  Current medications include amlodipine  for blood pressure, taken at 5 AM daily. She has about fifteen trazodone  tablets left and uses them as needed for sleep.      Medications: Outpatient Medications Prior to Visit   Medication Sig   budesonide -formoterol  (SYMBICORT ) 80-4.5 MCG/ACT inhaler Inhale 2 puffs into the lungs 2 (two) times daily.   Cholecalciferol (VITAMIN D3) 125 MCG (5000 UT) CAPS Take 1 capsule (5,000 Units total) by mouth daily.   magnesium gluconate (MAGONATE) 500 MG tablet Take 500 mg by mouth 2 (two) times daily.   [DISCONTINUED] amLODipine  (NORVASC ) 5 MG tablet Take 1 tablet (5 mg total) by mouth daily.   [DISCONTINUED] traZODone  (DESYREL ) 50 MG tablet Take 0.5-1 tablets (25-50 mg total) by mouth at bedtime as needed for sleep.   [DISCONTINUED] fluticasone -salmeterol (ADVAIR  DISKUS) 100-50 MCG/ACT AEPB Inhale 1 puff into the lungs 2 (two) times daily. (Patient not taking: Reported on 11/30/2023)   [DISCONTINUED] nortriptyline  (PAMELOR ) 10 MG capsule Take 1 capsule (10 mg total) by mouth at bedtime. (Patient not taking: Reported on 11/30/2023)   No facility-administered medications prior to visit.        Objective    BP 112/71 (BP Location: Left Arm, Patient Position: Sitting, Cuff Size: Normal)   Pulse 69   Temp 98.8 F (37.1 C) (Oral)   Ht 5' 7 (1.702 m)   Wt 152 lb 12.8 oz (69.3 kg)   SpO2 100%   BMI 23.93 kg/m     Physical Exam Constitutional:      Appearance: Normal appearance.  HENT:     Head: Normocephalic and atraumatic.  Eyes:     General: No scleral icterus.    Extraocular Movements: Extraocular  movements intact.     Conjunctiva/sclera: Conjunctivae normal.  Cardiovascular:     Rate and Rhythm: Normal rate and regular rhythm.     Pulses: Normal pulses.     Heart sounds: Normal heart sounds.  Pulmonary:     Effort: Pulmonary effort is normal. No respiratory distress.     Breath sounds: Normal breath sounds.  Musculoskeletal:       Back:     Right lower leg: No edema.     Left lower leg: No edema.     Comments: Regions of pain noted.  Skin:    General: Skin is warm and dry.  Neurological:     Mental Status: She is alert and oriented to person, place,  and time. Mental status is at baseline.  Psychiatric:        Mood and Affect: Mood normal.        Behavior: Behavior normal.      No results found for any visits on 04/19/24.  Assessment & Plan    Primary hypertension -     Microalbumin / creatinine urine ratio -     Comprehensive metabolic panel with GFR -     amLODIPine  Besylate; Take 1 tablet (5 mg total) by mouth daily.  Dispense: 90 tablet; Refill: 1  Shift work sleep disorder -     traZODone  HCl; Take 0.5-1 tablets (25-50 mg total) by mouth at bedtime as needed for sleep.  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  Dispense: 30 tablet; Refill: 1 -     Ibuprofen ; Take 1 tablet (600 mg total) by mouth at bedtime as needed. Try to minimize use  Dispense: 90 tablet; Refill: 0  Right hip pain -     Cyclobenzaprine  HCl; Take 1 tablet (5 mg total) by mouth at bedtime as needed for muscle spasms.  Dispense: 30 tablet; Refill: 1 -     Ibuprofen ; Take 1 tablet (600 mg total) by mouth at bedtime as needed. Try to minimize use  Dispense: 90 tablet; Refill: 0  Elevated LDL cholesterol level -     Lipid panel     Chronic right-sided low back pain without sciatica; right hip pain Pain likely due to occupational activities with improper bending and lifting. No sciatica symptoms. - Recommend stretching exercises and proper lifting techniques. - Provide handout with exercises including back bridges and sit-ups. - Prescribe ibuprofen  600 mg at bedtime as needed. - Prescribe cyclobenzaprine  5 mg at bedtime as needed. - Discuss importance of bending at knees when lifting.  Shiftwork sleep disorder Intermittent trazodone   use effective. Requests muscle relaxer for night pain. - Refill trazodone  prescription. - Discuss stretching exercises for back pain management.  Primary hypertension Chronic, well-controlled with amlodipine . Stable blood pressure readings. - Continue amlodipine  as prescribed. - Refill amlodipine  prescription.  Vitamin D  deficiency Not taking supplements for one month. Limited sun exposure and dietary sources. - Order vitamin D  level recheck. - Discuss importance of vitamin D  supplementation.     Return in about 4 weeks (around 05/17/2024) for CPE.      I discussed the assessment and treatment plan with the patient  The patient was provided an opportunity to ask questions and all were answered. The patient agreed with the plan and demonstrated an understanding of the instructions.   The patient was advised to call back or seek an in-person evaluation if the symptoms worsen or if the condition fails to improve as anticipated.    LAURAINE LOISE BUOY, DO  [facility] [phone] (phone) [phone] (fax)  [facility]

## 2024-05-01 ENCOUNTER — Other Ambulatory Visit: Payer: Self-pay | Admitting: Family Medicine

## 2024-05-01 ENCOUNTER — Ambulatory Visit: Payer: Self-pay | Admitting: Family Medicine

## 2024-05-01 LAB — COMPREHENSIVE METABOLIC PANEL WITH GFR
ALT: 17 IU/L (ref 0–32)
AST: 17 IU/L (ref 0–40)
Albumin: 3.6 g/dL — ABNORMAL LOW (ref 3.9–4.9)
Alkaline Phosphatase: 42 IU/L (ref 41–116)
BUN/Creatinine Ratio: 12 (ref 9–23)
BUN: 9 mg/dL (ref 6–24)
Bilirubin Total: 0.3 mg/dL (ref 0.0–1.2)
CO2: 22 mmol/L (ref 20–29)
Calcium: 8.7 mg/dL (ref 8.7–10.2)
Chloride: 106 mmol/L (ref 96–106)
Creatinine, Ser: 0.75 mg/dL (ref 0.57–1.00)
Globulin, Total: 1.5 g/dL (ref 1.5–4.5)
Glucose: 84 mg/dL (ref 70–99)
Potassium: 4.1 mmol/L (ref 3.5–5.2)
Sodium: 140 mmol/L (ref 134–144)
Total Protein: 5.1 g/dL — ABNORMAL LOW (ref 6.0–8.5)
eGFR: 99 mL/min/1.73 (ref >59)

## 2024-05-01 LAB — MICROALBUMIN / CREATININE URINE RATIO
Creatinine, Urine: 47.1 mg/dL
Microalb/Creat Ratio: 7 mg/g{creat} (ref 0–29)
Microalbumin, Urine: 3.4 ug/mL

## 2024-05-01 LAB — HEMOGLOBIN A1C
Est. average glucose Bld gHb Est-mCnc: 108 mg/dL
Hgb A1c MFr Bld: 5.4 % (ref 4.8–5.6)

## 2024-05-01 LAB — LIPID PANEL
Chol/HDL Ratio: 3.2 ratio (ref 0.0–4.4)
Cholesterol, Total: 164 mg/dL (ref 100–199)
HDL: 52 mg/dL (ref >39)
LDL Chol Calc (NIH): 101 mg/dL — ABNORMAL HIGH (ref 0–99)
Triglycerides: 55 mg/dL (ref 0–149)
VLDL Cholesterol Cal: 11 mg/dL (ref 5–40)

## 2024-05-01 LAB — VITAMIN D 25 HYDROXY (VIT D DEFICIENCY, FRACTURES): Vit D, 25-Hydroxy: 29.5 ng/mL — ABNORMAL LOW (ref 30.0–100.0)

## 2024-05-01 NOTE — Telephone Encounter (Unsigned)
 Copied from CRM [id number]. Topic: Clinical - Medication Refill >> May 01, 2024  2:16 PM Antony S wrote: Medication: Cholecalciferol (VITAMIN D3) 125 MCG (5000 UT) CAPS   Has the patient contacted their pharmacy? Yes (Agent: If no, request that the patient contact the pharmacy for the refill. If patient does not wish to contact the pharmacy document the reason why and proceed with request.) (Agent: If yes, when and what did the pharmacy advise?)  This is the patient's preferred pharmacy:  [id number] [place], [place] - [address] [place] [address] Phone: [phone] Fax: [phone]  Is this the correct pharmacy for this prescription? Yes If no, delete pharmacy and type the correct one.   Has the prescription been filled recently? No  Is the patient out of the medication? Yes  Has the patient been seen for an appointment in the last year OR does the patient have an upcoming appointment? Yes  Can we respond through MyChart? Yes  Agent: Please be advised that Rx refills may take up to 3 business days. We ask that you follow-up with your pharmacy.

## 2024-05-03 MED ORDER — VITAMIN D3 125 MCG (5000 UT) PO CAPS: 5000.0000 [IU] | ORAL_CAPSULE | Freq: Every day | ORAL | 1 refills | Status: AC

## 2024-05-03 NOTE — Telephone Encounter (Signed)
 Requested Prescriptions  Pending Prescriptions Disp Refills   Cholecalciferol (VITAMIN D3) 125 MCG (5000 UT) CAPS 90 capsule 1    Sig: Take 1 capsule (5,000 Units total) by mouth daily.     Endocrinology:  Vitamins - Vitamin D  Supplementation 2 Failed - 05/03/2024 11:42 AM      Failed - Manual Review: Route requests for 50,000 IU strength to the provider      Failed - Vitamin D  in normal range and within 360 days    Vit D, 25-Hydroxy  Date Value Ref Range Status  04/30/2024 29.5 (L) 30.0 - 100.0 ng/mL Final    Comment:    Vitamin D  deficiency has been defined by the Institute of Medicine and an Endocrine Society practice guideline as a level of serum 25-OH vitamin D  less than 20 ng/mL (1,2). The Endocrine Society went on to further define vitamin D  insufficiency as a level between 21 and 29 ng/mL (2). 1. IOM (Institute of Medicine). 2010. Dietary reference    intakes for calcium and D. [place]: The    [employer]. 2. Holick MF, Binkley [place], Bischoff-Ferrari HA, et al.    Evaluation, treatment, and prevention of vitamin D     deficiency: an Endocrine Society clinical practice    guideline. JCEM. 2011 Jul; 96(7):1911-30.          Passed - Ca in normal range and within 360 days    Calcium  Date Value Ref Range Status  04/30/2024 8.7 8.7 - 10.2 mg/dL Final   Calcium, Total  Date Value Ref Range Status  12/17/2011 9.1 8.5 - 10.1 mg/dL Final         Passed - Valid encounter within last 12 months    Recent Outpatient Visits           2 weeks ago Primary hypertension   [facility], Lauraine SAILOR, DO   6 months ago Pre-diabetes   [facility], Lauraine SAILOR, DO

## 2024-05-24 ENCOUNTER — Encounter: Admitting: Family Medicine

## 2024-06-11 ENCOUNTER — Ambulatory Visit: Admitting: Pulmonary Disease

## 2024-06-11 ENCOUNTER — Encounter: Payer: Self-pay | Admitting: Pulmonary Disease

## 2024-06-11 DIAGNOSIS — Z87891 Personal history of nicotine dependence: Secondary | ICD-10-CM | POA: Diagnosis not present

## 2024-06-11 DIAGNOSIS — J4530 Mild persistent asthma, uncomplicated: Secondary | ICD-10-CM | POA: Diagnosis not present

## 2024-06-11 DIAGNOSIS — R0600 Dyspnea, unspecified: Secondary | ICD-10-CM

## 2024-06-11 MED ORDER — BUDESONIDE-FORMOTEROL FUMARATE 80-4.5 MCG/ACT IN AERO: 2.0000 | INHALATION_SPRAY | Freq: Two times a day (BID) | RESPIRATORY_TRACT | 3 refills | Status: AC

## 2024-06-11 NOTE — Progress Notes (Signed)
 3  Synopsis: Referred in by Donzella Lauraine SAILOR, DO   Subjective:   PATIENT ID: Powell CHRISTELLA Single GENDER: female DOB: Dec 05, 1975, MRN: [id number]  No chief complaint on file.  HPI Ms. Hill is a 48 year old female patient with a past medical history of hypertension, presumed COPD on Trelegy presenting to the pulmonary clinic today to establish care.  She reports shortness of breath on exertion mostly going up the stairs. This started 4 months ago.She denies any chest tightness, wheezing, chest pain, cough or sputum production. Her mom passed away at 54 from a pulmonary heart disease? And this have her anxious.   She denies having the diagnosis of asthma. She denies any hypersensitivity to cold air, strong scents or humidity  Family history - Mom passed away at 24 of a cardiopulmonary diseaes ?   Social history - Ex smoker, quit in June 2024, smoked 1ppd for 25 years. She denies alcohol use, denies illicit drug use including marijuana. 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 Has 1 cat and 1 dog. She has 3 kids at home who are healthy.    OV 11/30/2023 - Ms. Delrose is here for follow-up on her PFTs results. Spirometry with FEV-1 at LLN, normal FVC, normal FEV-1/FVC ratio. No response to bronchodilators. Lung volumes are increased suggestive of air trapping. The diffusing capacity is normal. We discussed that this might be consistent of asthma and her symptoms of shortness of breath might be a result of that. We discussed trialing ICS-LABA and assessing response in 3 months which she is agreable with. Echocardiogram with nl EF 60 to 65%. RV normal. No signs of TR.   OV 06/11/2024 - Ms. Halliday is here to follow-up regarding her dyspnea on exertion specifically going up to stairs.  She had a good response with Symbicort  80-4.52 puffs twice daily.  Reports that now she can go up to stairs without any limitations.  Advised her to continue with Symbicort  as prescribed.  She had no hospitalizations or  requirement for systemic steroids since I last saw her in May 2025.  I will follow-up with her in 12 months.  ROS All systems were reviewed and are negative except for the above. Objective:   There were no vitals filed for this visit.    on RA BMI Readings from Last 3 Encounters:  04/19/24 23.93 kg/m  11/30/23 25.47 kg/m  11/30/23 25.47 kg/m   Wt Readings from Last 3 Encounters:  04/19/24 152 lb 12.8 oz (69.3 kg)  11/30/23 162 lb 9.6 oz (73.8 kg)  11/30/23 162 lb 9.6 oz (73.8 kg)    Physical Exam GEN: NAD, Healthy Appearing HEENT: Supple Neck, Reactive Pupils, EOMI  CVS: Normal S1, Normal S2, RRR, No murmurs or ES appreciated  Lungs: Clear bilateral air entry.  Abdomen: Soft, non tender, non distended, + BS  Extremities: Warm and well perfused, No edema   Labs and imaging were reviewed.  Ancillary Information   CBC    Component Value Date/Time   WBC 6.3 05/04/2023 1134   WBC 12.5 (H) 02/20/2022 2147   RBC 4.10 05/04/2023 1134   RBC 4.40 02/20/2022 2147   HGB 12.8 05/04/2023 1134   HCT 39.0 05/04/2023 1134   PLT 405 05/04/2023 1134   MCV 95 05/04/2023 1134   MCV 101 (H) 12/17/2011 0243   MCH 31.2 05/04/2023 1134   MCH 31.8 02/20/2022 2147   MCHC 32.8 05/04/2023 1134   MCHC 33.2 02/20/2022 2147   RDW 11.9 05/04/2023 1134  RDW 12.4 12/17/2011 0243   LYMPHSABS 3.4 (H) 05/04/2023 1134   MONOABS 0.4 07/12/2019 1555   EOSABS 0.3 05/04/2023 1134   BASOSABS 0.0 05/04/2023 1134       Latest Ref Rng & Units 11/30/2023    2:42 PM  PFT Results  FVC-Pre L 3.35   FVC-Predicted Pre % 84   FVC-Post L 3.27   FVC-Predicted Post % 82   Pre FEV1/FVC % % 77   Post FEV1/FCV % % 64   FEV1-Pre L 2.60   FEV1-Predicted Pre % 82   FEV1-Post L 2.11   DLCO uncorrected ml/min/mmHg 20.78   DLCO UNC% % 88   DLVA Predicted % 106   TLC L 6.27   TLC % Predicted % 113   RV % Predicted % 163      Assessment & Plan:  Ms. Calarco is a 48 year old female patient with a past  medical history of hypertension, presumed COPD on Trelegy presenting to the pulmonary clinic today to establish care.  # Mild persistent asthma. Describe as mild, only happens when going up the stairs. She recently joined [employer] and is walking only on a daily basis. PFTs overall normal but with some degree of air trapping suggestive of asthma.  She had a good response with Symbicort  80-4.52 puffs twice a day.  She has had no complications in the past 3 months.  She is doing overall well.  []  Continue with budesonide -formoterol  [Symbicort ] 80-4.52 puffs twice a day.  Advised mouth rinsing after each use. []  Continue with albuterol on an as needed basis. []  Continue with exercise on regular basis.  RTC 12 months.   I personally spent a total of 20 minutes in the care of the patient today including preparing to see the patient, getting/reviewing separately obtained history, performing a medically appropriate exam/evaluation, counseling and educating, documenting clinical information in the EHR, independently interpreting results, and communicating results.   Darrin Barn, MD [facility] 06/11/2024 9:03 AM

## 2024-06-12 ENCOUNTER — Other Ambulatory Visit: Payer: Self-pay | Admitting: Family Medicine

## 2024-06-12 DIAGNOSIS — Z1231 Encounter for screening mammogram for malignant neoplasm of breast: Secondary | ICD-10-CM

## 2024-06-19 ENCOUNTER — Other Ambulatory Visit: Payer: Self-pay | Admitting: Family Medicine

## 2024-06-19 DIAGNOSIS — M25551 Pain in right hip: Secondary | ICD-10-CM

## 2024-06-19 DIAGNOSIS — G8929 Other chronic pain: Secondary | ICD-10-CM

## 2024-07-13 ENCOUNTER — Ambulatory Visit (INDEPENDENT_AMBULATORY_CARE_PROVIDER_SITE_OTHER): Admitting: Family Medicine

## 2024-07-13 ENCOUNTER — Encounter: Payer: Self-pay | Admitting: Family Medicine

## 2024-07-13 VITALS — BP 126/81 | HR 82 | Ht 67.0 in | Wt 151.4 lb

## 2024-07-13 DIAGNOSIS — R202 Paresthesia of skin: Secondary | ICD-10-CM | POA: Diagnosis not present

## 2024-07-13 DIAGNOSIS — Z0000 Encounter for general adult medical examination without abnormal findings: Secondary | ICD-10-CM

## 2024-07-13 DIAGNOSIS — Z289 Immunization not carried out for unspecified reason: Secondary | ICD-10-CM | POA: Diagnosis not present

## 2024-07-13 DIAGNOSIS — M545 Low back pain, unspecified: Secondary | ICD-10-CM | POA: Diagnosis not present

## 2024-07-13 DIAGNOSIS — Z23 Encounter for immunization: Secondary | ICD-10-CM

## 2024-07-13 DIAGNOSIS — I10 Essential (primary) hypertension: Secondary | ICD-10-CM

## 2024-07-13 DIAGNOSIS — G8929 Other chronic pain: Secondary | ICD-10-CM

## 2024-07-13 DIAGNOSIS — M25551 Pain in right hip: Secondary | ICD-10-CM

## 2024-07-13 MED ORDER — IBUPROFEN 600 MG PO TABS: 600.0000 mg | ORAL_TABLET | Freq: Every evening | ORAL | 0 refills | Status: AC | PRN

## 2024-07-13 NOTE — Progress Notes (Signed)
 "    Complete physical exam   Patient: Paige Wade   DOB: [birth year]   49 y.o. Female  MRN: [id number] Visit Date: 07/13/2024  Today's healthcare provider: LAURAINE LOISE BUOY, DO   Chief Complaint  Patient presents with   Annual Exam    Last CPE 05/04/23 Concerns: charie horse in left leg that seemed to be getting worse when trying torub out, pin and needle/tinging feeling in left foot, cold starting Monday and taking mucinex and delsyn from otc Vaccination: Pneumococcal declined   Subjective    Paige Wade is a 49 y.o. female who presents today for a complete physical exam.  She reports consuming a general diet. The patient has a physically strenuous job, but has no regular exercise apart from work.  She generally feels fairly well. She reports sleeping fairly well. She does have additional problems to discuss today.   HPI HPI     Annual Exam    Additional comments: Last CPE 05/04/23 Concerns: charie horse in left leg that seemed to be getting worse when trying torub out, pin and needle/tinging feeling in left foot, cold starting Monday and taking mucinex and delsyn from otc Vaccination: Pneumococcal declined      Last edited by Lilian Fitzpatrick, CMA on 07/13/2024  3:52 PM.       Paige Wade Single is a 49 year old female with hypertension who presents for complete physical exam and with hip pain and tingling sensation in her foot.  She experienced a severe cramp in her hip a few days ago, which was extremely painful and did not subside with rubbing. She has had similar cramps occasionally in the past but never as severe as this recent episode. She is not on diuretics and is currently taking amlodipine  for blood pressure management. She maintains good hydration and takes magnesium supplements, 400 mg twice a day.  She reports a tingling sensation in her foot, which has been occurring for the past two weeks, about three nights a week. This sensation typically occurs at night and  is not associated with any specific activity or position. She experiences back pain almost daily. She occasionally takes B12 supplements, particularly an 'extreme energy' formulation when needed for work.  She has a history of donating plasma, with the last donation occurring on Monday. She ensures to hydrate well around the time of donation. She has been experiencing nasal congestion and a runny nose since Monday, which she attributes to a recent cold. She has been taking Mucinex to manage her symptoms.  Her sleep schedule is irregular due to her work hours, often going to bed around 9 AM after finishing work at [employer] AM. She reports difficulty sleeping, sometimes only getting an hour of sleep, which she feels affects her balance and coordination.     History reviewed. No pertinent past medical history. Past Surgical History:  Procedure Laterality Date   CESAREAN SECTION     COLONOSCOPY WITH PROPOFOL  N/A 07/22/2023   Procedure: COLONOSCOPY WITH PROPOFOL ;  Surgeon: Unk Corinn Skiff, MD;  [facility] END[facility]COPY;  Service: Gastroenterology;  Laterality: N/A;   TOOTH EXTRACTION N/A 03/06/2024   All of her teet extracted.   TUBAL LIGATION     Social History   Socioeconomic History   Marital status: Married    Spouse name: Not on file   Number of children: Not on file   Years of education: Not on file   Highest education level: 12th grade  Occupational History  Not on file  Tobacco Use   Smoking status: Former    Current packs/day: 0.00    Types: Cigarettes    Quit date: 05/08/1996    Years since quitting: 28.2   Smokeless tobacco: Never   Tobacco comments:    Quit 12/2022  Vaping Use   Vaping status: Never Used  Substance and Sexual Activity   Alcohol use: Never   Drug use: Never   Sexual activity: Not Currently  Other Topics Concern   Not on file  Social History Narrative   Not on file   Social Drivers of Health   Tobacco Use: Medium Risk (07/13/2024)   Patient  History    Smoking Tobacco Use: Former    Smokeless Tobacco Use: Never    Passive Exposure: Not on Actuary Strain: Low Risk (04/19/2024)   Overall Financial Resource Strain (CARDIA)    Difficulty of Paying Living Expenses: Not hard at all  Food Insecurity: No Food Insecurity (04/19/2024)   Epic    Worried About Programme Researcher, Broadcasting/film/video in the Last Year: Never true    Ran Out of Food in the Last Year: Never true  Transportation Needs: No Transportation Needs (04/19/2024)   Epic    Lack of Transportation (Medical): No    Lack of Transportation (Non-Medical): No  Physical Activity: Sufficiently Active (04/19/2024)   Exercise Vital Sign    Days of Exercise per Week: 5 days    Minutes of Exercise per Session: 60 min  Stress: Stress Concern Present (04/19/2024)   [employer] of Occupational Health - Occupational Stress Questionnaire    Feeling of Stress: To some extent  Social Connections: Unknown (04/19/2024)   Social Connection and Isolation Panel    Frequency of Communication with Friends and Family: Patient declined    Frequency of Social Gatherings with Friends and Family: Patient declined    Attends Religious Services: Patient declined    Database Administrator or Organizations: No    Attends Engineer, Structural: Not on file    Marital Status: Married  Catering Manager Violence: Not on file  Depression (PHQ2-9): Low Risk (07/13/2024)   Depression (PHQ2-9)    PHQ-2 Score: 0  Alcohol Screen: Low Risk (06/21/2023)   Alcohol Screen    Last Alcohol Screening Score (AUDIT): 0  Housing: Low Risk (04/19/2024)   Epic    Unable to Pay for Housing in the Last Year: No    Number of Times Moved in the Last Year: 1    Homeless in the Last Year: No  Utilities: Not on file  Health Literacy: Not on file   Family Status  Relation Name Status   Mother  (Not Specified)   Father  Deceased   MGM  (Not Specified)  No partnership data on file   Family History   Problem Relation Age of Onset   Hypertension Mother    Hypertension Father    Kidney failure Father 31 - 65   Breast cancer Maternal Grandmother    Allergies[1]  Patient Care Team: Randall Colden, Lauraine SAILOR, DO as PCP - General (Family Medicine)   Medications: Show/hide medication list[2]  Review of Systems  Constitutional:  Negative for chills, fatigue and fever.  HENT:  Positive for rhinorrhea. Negative for congestion, ear pain, sneezing and sore throat.   Eyes: Negative.  Negative for pain and redness.  Respiratory:  Negative for cough, shortness of breath and wheezing.   Cardiovascular:  Negative for chest pain and leg  swelling.  Gastrointestinal:  Negative for abdominal pain, blood in stool, constipation, diarrhea and nausea.  Endocrine: Negative for polydipsia and polyphagia.  Genitourinary: Negative.  Negative for dysuria, flank pain, hematuria, pelvic pain, vaginal bleeding and vaginal discharge.  Musculoskeletal:  Negative for arthralgias, back pain, gait problem and joint swelling.       +muscle cramping  Skin:  Negative for rash.  Neurological:  Positive for numbness. Negative for dizziness, tremors, seizures, weakness, light-headedness and headaches.  Hematological:  Negative for adenopathy.  Psychiatric/Behavioral: Negative.  Negative for behavioral problems, confusion and dysphoric mood. The patient is not nervous/anxious and is not hyperactive.       Objective    BP 126/81 (BP Location: Left Arm, Patient Position: Sitting, Cuff Size: Normal)   Pulse 82   Ht 5' 7 (1.702 m)   Wt 151 lb 6.4 oz (68.7 kg)   SpO2 100%   BMI 23.71 kg/m    Physical Exam Vitals and nursing note reviewed.  Constitutional:      General: She is awake.     Appearance: Normal appearance.  HENT:     Head: Normocephalic and atraumatic.     Right Ear: Tympanic membrane, ear canal and external ear normal.     Left Ear: Tympanic membrane, ear canal and external ear normal.     Nose: Congestion  present.     Mouth/Throat:     Mouth: Mucous membranes are moist.     Pharynx: Oropharynx is clear. No oropharyngeal exudate or posterior oropharyngeal erythema.  Eyes:     General: No scleral icterus.    Extraocular Movements: Extraocular movements intact.     Conjunctiva/sclera: Conjunctivae normal.     Pupils: Pupils are equal, round, and reactive to light.  Neck:     Thyroid: No thyromegaly or thyroid tenderness.  Cardiovascular:     Rate and Rhythm: Normal rate and regular rhythm.     Pulses: Normal pulses.     Heart sounds: Normal heart sounds.  Pulmonary:     Effort: Pulmonary effort is normal. No tachypnea, bradypnea or respiratory distress.     Breath sounds: Normal breath sounds. No stridor. No wheezing, rhonchi or rales.  Abdominal:     General: Bowel sounds are normal. There is no distension.     Palpations: Abdomen is soft. There is no mass.     Tenderness: There is no abdominal tenderness. There is no guarding.     Hernia: No hernia is present.  Musculoskeletal:     Cervical back: Normal range of motion and neck supple.     Right lower leg: No edema.     Left lower leg: No edema.  Lymphadenopathy:     Cervical: No cervical adenopathy.  Skin:    General: Skin is warm and dry.  Neurological:     Mental Status: She is alert and oriented to person, place, and time. Mental status is at baseline.  Psychiatric:        Mood and Affect: Mood normal.        Behavior: Behavior normal.      Last depression screening scores    07/13/2024    3:51 PM 05/04/2023   10:28 AM  PHQ 2/9 Scores  PHQ - 2 Score 0 0   Last fall risk screening    07/13/2024    3:51 PM  Fall Risk   Falls in the past year? 0  Number falls in past yr: 0  Injury with Fall? 0  Risk  for fall due to : No Fall Risks   Last Audit-C alcohol use screening    04/19/2024    4:52 AM  Alcohol Use Disorder Test (AUDIT)  1. How often do you have a drink containing alcohol? 0   3. How often do you have  six or more drinks on one occasion? 0      Manually entered by patient   A score of 3 or more in women, and 4 or more in men indicates increased risk for alcohol abuse, EXCEPT if all of the points are from question 1   No results found for any visits on 07/13/24.  Assessment & Plan    Routine Health Maintenance and Physical Exam  Exercise Activities and Dietary recommendations  Goals   None     Immunization History  Administered Date(s) Administered   PFIZER(Purple Top)SARS-COV-2 Vaccination 03/19/2020, 04/23/2020    Health Maintenance  Topic Date Due   Hepatitis B Vaccines 19-59 Average Risk (1 of 3 - 19+ 3-dose series) Never done   Influenza Vaccine  10/02/2024 (Originally 02/03/2024)   DTaP/Tdap/Td (1 - Tdap) 11/01/2024 (Originally 05/09/1995)   COVID-19 Vaccine (3 - 2025-26 season) 03/05/2025 (Originally 03/05/2024)   Pneumococcal Vaccine (1 of 2 - PCV) 07/13/2025 (Originally 05/09/1995)   Mammogram  07/20/2026   Cervical Cancer Screening (HPV/Pap Cotest)  05/03/2028   Colonoscopy  07/21/2033   HPV VACCINES (No Doses Required) Completed   Hepatitis C Screening  Completed   HIV Screening  Completed   Meningococcal B Vaccine  Aged Out    Discussed health benefits of physical activity, and encouraged her to engage in regular exercise appropriate for her age and condition.   Annual physical exam  Chronic right-sided low back pain without sciatica -     Ibuprofen ; Take 1 tablet (600 mg total) by mouth at bedtime as needed. Try to minimize use  Dispense: 90 tablet; Refill: 0  Right hip pain -     Ibuprofen ; Take 1 tablet (600 mg total) by mouth at bedtime as needed. Try to minimize use  Dispense: 90 tablet; Refill: 0  Paresthesia of foot  Need for pneumococcal 20-valent conjugate vaccination  Primary hypertension  Hepatitis B vaccination administered at current visit    Annual physical exam Physical exam overall unremarkable except as noted above. Routine lab work  up-to-date. Discussed vaccinations; agreed to hepatitis B vaccine series, declined pneumonia vaccine.  Chronic right-sided low back pain without sciatica; right hip pain Ongoing low back pain with intermittent right hip pain.  Discussed stretching and strengthening exercises to reduce recurrence.  Prescribed high-dose ibuprofen  to be used sparingly at bedtime as needed.  Paresthesia of foot Intermittent right foot tingling, possibly due to dehydration or electrolyte imbalance. Reduce high-dose B12 consumption (extreme energy supplements), increase fluids for cramping and tingling.  Discussed checking vitamin B12 level and iron panel; deferred for time being - Increase fluid intake, especially after plasma donation. - Consider electrolyte solutions like Gatorade or Powerade. - Monitor symptoms and consider checking B12 levels if symptoms persist. - Recommend reducing consumption of high-dose vitamin B12 supplements  Primary hypertension Chronic, blood pressure 126/81 mmHg today, borderline high. Discussed potential medication adjustment. Lack of sleep may contribute to elevated levels. - Continue amlodipine  5 mg daily unchanged. - Monitor blood pressure regularly at home. - Encouraged adequate sleep to help manage blood pressure.     Return in about 6 months (around 01/10/2025) for HTN, Chronic f/u w/Dr. Franchot.     I discussed  the assessment and treatment plan with the patient  The patient was provided an opportunity to ask questions and all were answered. The patient agreed with the plan and demonstrated an understanding of the instructions.   The patient was advised to call back or seek an in-person evaluation if the symptoms worsen or if the condition fails to improve as anticipated.    LAURAINE LOISE BUOY, DO  [facility] [phone] (phone) [phone] (fax)  [facility]     [1] No Known Allergies [2]  Outpatient Medications Prior to  Visit  Medication Sig   amLODipine  (NORVASC ) 5 MG tablet Take 1 tablet (5 mg total) by mouth daily.   budesonide -formoterol  (SYMBICORT ) 80-4.5 MCG/ACT inhaler Inhale 2 puffs into the lungs 2 (two) times daily.   Cholecalciferol (VITAMIN D3) 125 MCG (5000 UT) CAPS Take 1 capsule (5,000 Units total) by mouth daily.   cyclobenzaprine  (FLEXERIL ) 5 MG tablet TAKE 1 TABLET BY MOUTH AT BEDTIME AS NEEDED FOR MUSCLE SPASMS.   magnesium gluconate (MAGONATE) 500 MG tablet Take 500 mg by mouth 2 (two) times daily.   [DISCONTINUED] ibuprofen  (ADVIL ) 600 MG tablet Take 1 tablet (600 mg total) by mouth at bedtime as needed. Try to minimize use   [DISCONTINUED] traZODone  (DESYREL ) 50 MG tablet Take 0.5-1 tablets (25-50 mg total) by mouth at bedtime as needed for sleep.   No facility-administered medications prior to visit.   "

## 2024-07-15 ENCOUNTER — Other Ambulatory Visit: Payer: Self-pay | Admitting: Family Medicine

## 2024-07-15 DIAGNOSIS — G4726 Circadian rhythm sleep disorder, shift work type: Secondary | ICD-10-CM

## 2024-07-16 NOTE — Telephone Encounter (Signed)
 LOV- 07/13/2024 NOV- 01/10/2025 with Franchot [facility]- 04/19/2024 Outpatient Medication Detail   Disp Refills Start End   traZODone  (DESYREL ) 50 MG tablet 90 tablet 0 04/19/2024 --   Sig - Route: Take 0.5-1 tablets (25-50 mg total) by mouth at bedtime as needed for sleep. - Oral   Sent to pharmacy as: traZODone  (DESYREL ) 50 MG tablet   E-Prescribing Status: Receipt confirmed by pharmacy (04/19/2024  8:35 AM EDT)

## 2024-07-20 ENCOUNTER — Ambulatory Visit
Admission: RE | Admit: 2024-07-20 | Discharge: 2024-07-20 | Disposition: A | Discharge status: Home / Self Care | Source: Ambulatory Visit | Attending: Family Medicine | Admitting: Family Medicine

## 2024-07-20 DIAGNOSIS — Z1231 Encounter for screening mammogram for malignant neoplasm of breast: Secondary | ICD-10-CM | POA: Diagnosis present

## 2024-07-25 ENCOUNTER — Ambulatory Visit: Payer: Self-pay | Admitting: Family Medicine

## 2025-01-10 ENCOUNTER — Ambulatory Visit
